# Patient Record
Sex: Male | Born: 1945
Health system: Southern US, Community
[De-identification: ages and names within clinical notes are randomized; demographics above are authoritative.]

## PROBLEM LIST (undated history)

## (undated) DIAGNOSIS — Z860100 Personal history of colon polyps, unspecified: Secondary | ICD-10-CM

## (undated) DIAGNOSIS — M5430 Sciatica, unspecified side: Secondary | ICD-10-CM

## (undated) DIAGNOSIS — M7138 Other bursal cyst, other site: Secondary | ICD-10-CM

## (undated) DIAGNOSIS — Z8619 Personal history of other infectious and parasitic diseases: Secondary | ICD-10-CM

## (undated) DIAGNOSIS — Z91018 Allergy to other foods: Secondary | ICD-10-CM

## (undated) DIAGNOSIS — I10 Essential (primary) hypertension: Secondary | ICD-10-CM

## (undated) DIAGNOSIS — K219 Gastro-esophageal reflux disease without esophagitis: Secondary | ICD-10-CM

## (undated) DIAGNOSIS — D6851 Activated protein C resistance: Secondary | ICD-10-CM

## (undated) HISTORY — DX: Personal history of colon polyps, unspecified: Z86.0100

## (undated) HISTORY — DX: Personal history of other infectious and parasitic diseases: Z86.19

## (undated) HISTORY — DX: Personal history of colon polyps: Z86.010

## (undated) HISTORY — DX: Sciatica, unspecified side: M54.30

## (undated) HISTORY — DX: Allergy to other foods: Z91.018

## (undated) HISTORY — DX: Gastro-esophageal reflux disease without esophagitis: K21.9

## (undated) HISTORY — PX: BACK SURGERY: SHX140

## (undated) HISTORY — DX: Other bursal cyst, other site: M71.38

## (undated) HISTORY — PX: COLONOSCOPY: SHX174

---

## 1971-03-16 HISTORY — PX: TONSILLECTOMY AND ADENOIDECTOMY: SUR1326

## 1999-05-14 ENCOUNTER — Encounter: Admission: RE | Admit: 1999-05-14 | Discharge: 1999-05-14 | Payer: Self-pay | Admitting: Family Medicine

## 1999-05-14 ENCOUNTER — Encounter: Payer: Self-pay | Admitting: Family Medicine

## 2004-08-21 ENCOUNTER — Ambulatory Visit: Payer: Self-pay | Admitting: Internal Medicine

## 2004-09-01 ENCOUNTER — Ambulatory Visit: Payer: Self-pay | Admitting: Internal Medicine

## 2006-05-06 ENCOUNTER — Ambulatory Visit: Payer: Self-pay | Admitting: Internal Medicine

## 2006-05-09 ENCOUNTER — Encounter: Admission: RE | Admit: 2006-05-09 | Discharge: 2006-05-09 | Payer: Self-pay | Admitting: Internal Medicine

## 2006-06-01 ENCOUNTER — Ambulatory Visit: Payer: Self-pay | Admitting: Internal Medicine

## 2006-06-21 ENCOUNTER — Encounter: Admission: RE | Admit: 2006-06-21 | Discharge: 2006-06-21 | Payer: Self-pay | Admitting: Internal Medicine

## 2007-05-18 ENCOUNTER — Ambulatory Visit: Payer: Self-pay | Admitting: Internal Medicine

## 2007-05-18 LAB — CONVERTED CEMR LAB
ALT: 28 units/L (ref 0–53)
AST: 22 units/L (ref 0–37)
Albumin: 3.9 g/dL (ref 3.5–5.2)
Alkaline Phosphatase: 41 units/L (ref 39–117)
BUN: 16 mg/dL (ref 6–23)
Basophils Absolute: 0.1 10*3/uL (ref 0.0–0.1)
Basophils Relative: 0.9 % (ref 0.0–1.0)
Bilirubin Urine: NEGATIVE
Bilirubin, Direct: 0.2 mg/dL (ref 0.0–0.3)
CO2: 31 meq/L (ref 19–32)
Calcium: 9.2 mg/dL (ref 8.4–10.5)
Chloride: 105 meq/L (ref 96–112)
Cholesterol: 198 mg/dL (ref 0–200)
Creatinine, Ser: 1.3 mg/dL (ref 0.4–1.5)
Eosinophils Absolute: 0.2 10*3/uL (ref 0.0–0.6)
Eosinophils Relative: 2.4 % (ref 0.0–5.0)
GFR calc Af Amer: 73 mL/min
GFR calc non Af Amer: 60 mL/min
Glucose, Bld: 101 mg/dL — ABNORMAL HIGH (ref 70–99)
HCT: 48.2 % (ref 39.0–52.0)
HDL: 52.8 mg/dL (ref >39.0)
Hemoglobin, Urine: NEGATIVE
Hemoglobin: 16.2 g/dL (ref 13.0–17.0)
LDL Cholesterol: 122 mg/dL — ABNORMAL HIGH (ref 0–99)
Leukocytes, UA: NEGATIVE
Lymphocytes Relative: 17.7 % (ref 12.0–46.0)
MCHC: 33.7 g/dL (ref 30.0–36.0)
MCV: 92.9 fL (ref 78.0–100.0)
Monocytes Absolute: 0.8 10*3/uL — ABNORMAL HIGH (ref 0.2–0.7)
Monocytes Relative: 10 % (ref 3.0–11.0)
Neutro Abs: 5.4 10*3/uL (ref 1.4–7.7)
Neutrophils Relative %: 69 % (ref 43.0–77.0)
Nitrite: NEGATIVE
PSA: 0.29 ng/mL (ref 0.10–4.00)
Platelets: 194 10*3/uL (ref 150–400)
Potassium: 4.4 meq/L (ref 3.5–5.1)
RBC: 5.19 M/uL (ref 4.22–5.81)
RDW: 12 % (ref 11.5–14.6)
Sodium: 141 meq/L (ref 135–145)
Specific Gravity, Urine: 1.015 (ref 1.000–1.03)
TSH: 1.46 microintl units/mL (ref 0.35–5.50)
Total Bilirubin: 1 mg/dL (ref 0.3–1.2)
Total CHOL/HDL Ratio: 3.8
Total Protein, Urine: NEGATIVE mg/dL
Total Protein: 6.4 g/dL (ref 6.0–8.3)
Triglycerides: 116 mg/dL (ref 0–149)
Urine Glucose: NEGATIVE mg/dL
Urobilinogen, UA: 0.2 (ref 0.0–1.0)
VLDL: 23 mg/dL (ref 0–40)
WBC: 7.9 10*3/uL (ref 4.5–10.5)
pH: 7 (ref 5.0–8.0)

## 2007-05-19 ENCOUNTER — Ambulatory Visit: Payer: Self-pay | Admitting: Internal Medicine

## 2007-05-19 DIAGNOSIS — IMO0002 Reserved for concepts with insufficient information to code with codable children: Secondary | ICD-10-CM | POA: Insufficient documentation

## 2007-05-25 ENCOUNTER — Encounter: Payer: Self-pay | Admitting: *Deleted

## 2007-05-25 DIAGNOSIS — Z87448 Personal history of other diseases of urinary system: Secondary | ICD-10-CM | POA: Insufficient documentation

## 2007-05-25 DIAGNOSIS — Z9089 Acquired absence of other organs: Secondary | ICD-10-CM

## 2008-02-16 ENCOUNTER — Encounter: Payer: Self-pay | Admitting: Internal Medicine

## 2008-06-27 ENCOUNTER — Encounter: Payer: Self-pay | Admitting: Internal Medicine

## 2009-02-25 ENCOUNTER — Telehealth: Payer: Self-pay | Admitting: Internal Medicine

## 2009-03-27 ENCOUNTER — Telehealth: Payer: Self-pay | Admitting: Internal Medicine

## 2009-04-28 ENCOUNTER — Telehealth: Payer: Self-pay | Admitting: Internal Medicine

## 2009-06-18 ENCOUNTER — Encounter: Payer: Self-pay | Admitting: Internal Medicine

## 2009-08-20 ENCOUNTER — Telehealth: Payer: Self-pay | Admitting: Internal Medicine

## 2009-08-29 ENCOUNTER — Encounter (INDEPENDENT_AMBULATORY_CARE_PROVIDER_SITE_OTHER): Payer: Self-pay | Admitting: *Deleted

## 2009-09-10 ENCOUNTER — Encounter (INDEPENDENT_AMBULATORY_CARE_PROVIDER_SITE_OTHER): Payer: Self-pay

## 2009-09-16 ENCOUNTER — Ambulatory Visit: Payer: Self-pay | Admitting: Internal Medicine

## 2009-09-30 ENCOUNTER — Ambulatory Visit: Payer: Self-pay | Admitting: Internal Medicine

## 2009-09-30 DIAGNOSIS — Z860100 Personal history of colon polyps, unspecified: Secondary | ICD-10-CM | POA: Insufficient documentation

## 2009-10-07 ENCOUNTER — Encounter: Payer: Self-pay | Admitting: Internal Medicine

## 2010-01-29 ENCOUNTER — Telehealth: Payer: Self-pay | Admitting: Internal Medicine

## 2010-02-13 ENCOUNTER — Ambulatory Visit: Payer: Self-pay | Admitting: Internal Medicine

## 2010-02-13 LAB — CONVERTED CEMR LAB
ALT: 24 units/L (ref 0–53)
AST: 24 units/L (ref 0–37)
Albumin: 4 g/dL (ref 3.5–5.2)
Alkaline Phosphatase: 42 units/L (ref 39–117)
BUN: 23 mg/dL (ref 6–23)
Basophils Absolute: 0 10*3/uL (ref 0.0–0.1)
Basophils Relative: 0.7 % (ref 0.0–3.0)
Bilirubin Urine: NEGATIVE
Bilirubin, Direct: 0.1 mg/dL (ref 0.0–0.3)
CO2: 29 meq/L (ref 19–32)
Calcium: 9.3 mg/dL (ref 8.4–10.5)
Chloride: 106 meq/L (ref 96–112)
Cholesterol: 211 mg/dL — ABNORMAL HIGH (ref 0–200)
Creatinine, Ser: 1.1 mg/dL (ref 0.4–1.5)
Direct LDL: 131.7 mg/dL
Eosinophils Absolute: 0.3 10*3/uL (ref 0.0–0.7)
Eosinophils Relative: 4.5 % (ref 0.0–5.0)
GFR calc non Af Amer: 73.48 mL/min (ref >60)
Glucose, Bld: 91 mg/dL (ref 70–99)
HCT: 46.4 % (ref 39.0–52.0)
HDL: 59.3 mg/dL (ref >39.00)
Hemoglobin, Urine: NEGATIVE
Hemoglobin: 15.9 g/dL (ref 13.0–17.0)
Ketones, ur: NEGATIVE mg/dL
Leukocytes, UA: NEGATIVE
Lymphocytes Relative: 24.6 % (ref 12.0–46.0)
Lymphs Abs: 1.4 10*3/uL (ref 0.7–4.0)
MCHC: 34.2 g/dL (ref 30.0–36.0)
MCV: 93.2 fL (ref 78.0–100.0)
Monocytes Absolute: 0.7 10*3/uL (ref 0.1–1.0)
Monocytes Relative: 11.8 % (ref 3.0–12.0)
Neutro Abs: 3.3 10*3/uL (ref 1.4–7.7)
Neutrophils Relative %: 58.4 % (ref 43.0–77.0)
Nitrite: NEGATIVE
PSA: 0.29 ng/mL (ref 0.10–4.00)
Platelets: 195 10*3/uL (ref 150.0–400.0)
Potassium: 4.7 meq/L (ref 3.5–5.1)
RBC: 4.99 M/uL (ref 4.22–5.81)
RDW: 13.3 % (ref 11.5–14.6)
Sodium: 141 meq/L (ref 135–145)
Specific Gravity, Urine: 1.01 (ref 1.000–1.030)
TSH: 1.71 microintl units/mL (ref 0.35–5.50)
Total Bilirubin: 1 mg/dL (ref 0.3–1.2)
Total CHOL/HDL Ratio: 4
Total Protein, Urine: NEGATIVE mg/dL
Total Protein: 6.5 g/dL (ref 6.0–8.3)
Triglycerides: 58 mg/dL (ref 0.0–149.0)
Urine Glucose: NEGATIVE mg/dL
Urobilinogen, UA: 0.2 (ref 0.0–1.0)
VLDL: 11.6 mg/dL (ref 0.0–40.0)
WBC: 5.7 10*3/uL (ref 4.5–10.5)
pH: 7 (ref 5.0–8.0)

## 2010-02-19 ENCOUNTER — Ambulatory Visit: Payer: Self-pay | Admitting: Internal Medicine

## 2010-02-19 ENCOUNTER — Encounter: Payer: Self-pay | Admitting: Internal Medicine

## 2010-04-14 NOTE — Letter (Signed)
Summary: Patient Notice- Polyp Results   Gastroenterology  [address] [place], [place] [address]   Phone: [phone]  Fax: [phone]        October 07, 2009 MRN: [id number]    [facility] [address] [place], [place]  [address]    Dear Steven Hartman,  The polyps removed from your colon were adenomatous. This means that they were pre-cancerous or that  they had the potential to change into cancer over time.  I recommend that you have a repeat colonoscopy in 5 years to determine if you have developed any new polyps over time. If you develop any new rectal bleeding, abdominal pain or significant bowel habit changes, please contact us before then.  Please call us if you are having persistent problems or have questions about your condition that have not been fully answered at this time.  Sincerely,  Iva Boop MD, [facility]  This letter has been electronically signed by your physician.  Appended Document: Patient Notice- Polyp Results letter mailed

## 2010-04-14 NOTE — Letter (Signed)
Summary: [facility]  [facility]   Imported By: Sherian Rein 07/10/2009 09:12:44  _____________________________________________________________________  External Attachment:    Type:   Image     Comment:   External Document

## 2010-04-14 NOTE — Miscellaneous (Signed)
Summary: Lec previsit  Clinical Lists Changes  Medications: Added new medication of MOVIPREP 100 GM  SOLR (PEG-KCL-NACL-NASULF-NA ASC-C) As per prep instructions. - Signed Rx of MOVIPREP 100 GM  SOLR (PEG-KCL-NACL-NASULF-NA ASC-C) As per prep instructions.;  #1 x 0;  Signed;  Entered by: Ulis Rias RN;  Authorized by: Iva Boop MD, FACG;  Method used: Electronically to [facility]*, [address] [place], [place]  [address], Ph: [phone] or [phone], Fax: [phone] Observations: Added new observation of NKA: T (09/16/2009 13:25)    Prescriptions: MOVIPREP 100 GM  SOLR (PEG-KCL-NACL-NASULF-NA ASC-C) As per prep instructions.  #1 x 0   Entered by:   Ulis Rias RN   Authorized by:   Iva Boop MD, [facility]   Signed by:   Ulis Rias RN on 09/16/2009   Method used:   Electronically to        [facility]* (retail)       [address] [place], [place]  [address]       Ph: [phone] or [phone]       Fax: [phone]   RxID:   [id number]

## 2010-04-14 NOTE — Procedures (Signed)
Summary: Colonoscopy  Patient: Alexy Heldt Note: All result statuses are Final unless otherwise noted.  Tests: (1) Colonoscopy (COL)   COL Colonoscopy           DONE     [facility]     [address]     [place], [place]  [address]           COLONOSCOPY PROCEDURE REPORT           PATIENT:  Jahvon, Gosline  MR#:  [id number]     BIRTHDATE:  [birth year], 64 yrs. old  GENDER:  male     ENDOSCOPIST:  Iva Boop, MD, [facility]           PROCEDURE DATE:  09/30/2009     PROCEDURE:  Colonoscopy with biopsy     ASA CLASS:  Class I     INDICATIONS:  Elevated Risk Screening, family Hx of polyps     MEDICATIONS:   Fentanyl 50 mcg IV, Versed 7 mg IV           DESCRIPTION OF PROCEDURE:   After the risks benefits and     alternatives of the procedure were thoroughly explained, informed     consent was obtained.  Digital rectal exam was performed and     revealed no abnormalities and normal prostate.   The LB CF-H180AL     [id number] endoscope was introduced through the anus and advanced to     the cecum, which was identified by both the appendix and ileocecal     valve, without limitations.  The quality of the prep was     excellent, using MoviPrep.  The instrument was then slowly     withdrawn as the colon was fully examined.     Insertion: 3:23 minutes Withdrawal: 9:39 minutes     <<PROCEDUREIMAGES>>           FINDINGS:  Two polyps were found. 2-3 mm ascending colon polyp     and 3-4 mm possible cecal polyp. The polyps were removed using cold     biopsy forceps.  Mild diverticulosis was found in the sigmoid     colon.  This was otherwise a normal examination of the colon.     Retroflexed views in the right colon and rectum revealed no     abnormalities.    The scope was then withdrawn from the patient     and the procedure completed.           COMPLICATIONS:  None     ENDOSCOPIC IMPRESSION:     1) Two polyps removed (very small)     2) Mild diverticulosis in the  sigmoid colon     3) Otherwise normal examination, excellent prep           REPEAT EXAM:  In for Colonoscopy, pending biopsy results. 1     brother with polyp(s) so based upon current guidelines if no     adenomas would repeat routine screening in 10 years.           Iva Boop, MD, [facility]           CC:  The Patient           n.     eSIGNED:   Iva Boop at 09/30/2009 02:13 PM           Faniel, Gasper Sells, [phone]  Note: An exclamation mark (!) indicates a result that was not dispersed into the  flowsheet. Document Creation Date: 09/30/2009 2:13 PM _______________________________________________________________________  (1) Order result status: Final Collection or observation date-time: 09/30/2009 14:03 Requested date-time:  Receipt date-time:  Reported date-time:  Referring Physician:   Ordering Physician: Stan Head [id number]) Specimen Source:  Source: Launa Grill Order Number: [id number] Lab site:   Appended Document: Colonoscopy     Procedures Next Due Date:    Colonoscopy: 10/2014

## 2010-04-14 NOTE — Progress Notes (Signed)
  Phone Note Refill Request Message from:  Fax from Pharmacy on March 27, 2009 2:44 PM  recieved fax from [facility]. Please Advise refill on Meloxicam 15mg  one tab daily.  Initial call taken by: Ami Bullins CMA,  March 27, 2009 2:45 PM  Follow-up for Phone Call        OK for refill on meloxicam as needed  Follow-up by: Jacques Navy MD,  March 27, 2009 5:31 PM    New/Updated Medications: MELOXICAM 15 MG TABS (MELOXICAM) 1 tab once daily Prescriptions: MELOXICAM 15 MG TABS (MELOXICAM) 1 tab once daily  #30 x 1   Entered by:   Ami Bullins CMA   Authorized by:   Jacques Navy MD   Signed by:   Bill Salinas CMA on 03/28/2009   Method used:   Electronically to        [facility]* (retail)       [address] [place], [place]  [address]       Ph: [phone] or [phone]       Fax: [phone]   RxID:   [id number]

## 2010-04-14 NOTE — Progress Notes (Signed)
Summary: Allegra refill  Phone Note Refill Request Message from:  Fax from Pharmacy on April 28, 2009 4:23 PM  Refills Requested: Medication #1:  ALLEGRA 180 MG TABS Take 1 tablet by mouth once a day   Dosage confirmed as above?Dosage Confirmed Initial call taken by: Lucious Groves,  April 28, 2009 4:23 PM    Prescriptions: ALLEGRA 180 MG TABS (FEXOFENADINE HCL) Take 1 tablet by mouth once a day  #30 x 12   Entered by:   Lucious Groves   Authorized by:   Jacques Navy MD   Signed by:   Lucious Groves on 04/28/2009   Method used:   Electronically to        [facility]* (retail)       [address] [place], [place]  [address]       Ph: [phone] or [phone]       Fax: [phone]   RxID:   [id number]

## 2010-04-14 NOTE — Letter (Signed)
Summary: Moviprep Instructions  [facility]  [address]   [place], [place] [address]   Phone: [phone]  Fax: [phone]       Steven Hartman    [birth year]    MRN: [id number]        Procedure Day /Date: Tuesday, 09-30-09     Arrival Time: 12:30 p.m.     Procedure Time: 1:30 p.m.     Location of Procedure:                    x   [facility] (4th Floor)                        PREPARATION FOR COLONOSCOPY WITH MOVIPREP   Starting 5 days prior to your procedure 09-25-09  do not eat nuts, seeds, popcorn, corn, beans, peas,  salads, or any raw vegetables.  Do not take any fiber supplements (e.g. Metamucil, Citrucel, and Benefiber).  THE DAY BEFORE YOUR PROCEDURE         DATE: 09-29-09   DAY: Monday  1.  Drink clear liquids the entire day-NO SOLID FOOD  2.  Do not drink anything colored red or purple.  Avoid juices with pulp.  No orange juice.  3.  Drink at least 64 oz. (8 glasses) of fluid/clear liquids during the day to prevent dehydration and help the prep work efficiently.  CLEAR LIQUIDS INCLUDE: Water Jello Ice Popsicles Tea (sugar ok, no milk/cream) Powdered fruit flavored drinks Coffee (sugar ok, no milk/cream) Gatorade Juice: apple, white grape, white cranberry  Lemonade Clear bullion, consomm, broth Carbonated beverages (any kind) Strained chicken noodle soup Hard Candy                             4.  In the morning, mix first dose of MoviPrep solution:    Empty 1 Pouch A and 1 Pouch B into the disposable container    Add lukewarm drinking water to the top line of the container. Mix to dissolve    Refrigerate (mixed solution should be used within 24 hrs)  5.  Begin drinking the prep at 5:00 p.m. The MoviPrep container is divided by 4 marks.   Every 15 minutes drink the solution down to the next mark (approximately 8 oz) until the full liter is complete.   6.  Follow completed prep with 16 oz of clear liquid of your  choice (Nothing red or purple).  Continue to drink clear liquids until bedtime.  7.  Before going to bed, mix second dose of MoviPrep solution:    Empty 1 Pouch A and 1 Pouch B into the disposable container    Add lukewarm drinking water to the top line of the container. Mix to dissolve    Refrigerate  THE DAY OF YOUR PROCEDURE      DATE: 09-30-09   DAY: Tuesday  Beginning at 8:30 a.m. (5 hours before procedure):         1. Every 15 minutes, drink the solution down to the next mark (approx 8 oz) until the full liter is complete.  2. Follow completed prep with 16 oz. of clear liquid of your choice.    3. You may drink clear liquids until 11:30 a.m.  (2 HOURS BEFORE PROCEDURE).   MEDICATION INSTRUCTIONS  Unless otherwise instructed, you should take regular prescription medications with a small sip of water   as early  as possible the morning of your procedure.         OTHER INSTRUCTIONS  You will need a responsible adult at least 63 years of age to accompany you and drive you home.   This person must remain in the waiting room during your procedure.  Wear loose fitting clothing that is easily removed.  Leave jewelry and other valuables at home.  However, you may wish to bring a book to read or  an iPod/MP3 player to listen to music as you wait for your procedure to start.  Remove all body piercing jewelry and leave at home.  Total time from sign-in until discharge is approximately 2-3 hours.  You should go home directly after your procedure and rest.  You can resume normal activities the  day after your procedure.  The day of your procedure you should not:   Drive   Make legal decisions   Operate machinery   Drink alcohol   Return to work  You will receive specific instructions about eating, activities and medications before you leave.    The above instructions have been reviewed and explained to me by   Ulis Rias RN  September 16, 2009 1:44 PM     I  fully understand and can verbalize these instructions _____________________________ Date _________

## 2010-04-14 NOTE — Progress Notes (Signed)
Summary: Colon Recall  Phone Note Outgoing Call Call back at Home Phone [phone]   Call placed by: Francee Piccolo CMA Duncan Dull),  August 20, 2009 3:29 PM Summary of Call: message left for pt to return call regarding colon recall Francee Piccolo CMA Duncan Dull)  August 20, 2009 3:29 PM   RC from pt.  He states his brother has a history of colon polyps-unsure type.  There is no family history of colon cancer.  I advised pt Dr. Leone Payor would review this information and we would call him later today or the first of next week with recommendation of when to repeat colonoscopy.  Pt voices understanding and is agreeable. Initial call taken by: Francee Piccolo CMA Duncan Dull),  August 22, 2009 11:34 AM  Follow-up for Phone Call        repeat colonoscopy this year re: screening and family hx of polyps Follow-up by: Iva Boop MD, [facility],  August 26, 2009 10:36 AM  Additional Follow-up for Phone Call Additional follow up Details #1::        message left with young male at home number for pt to return call. Francee Piccolo CMA Duncan Dull)  August 26, 2009 2:28 PM   no answer at home number. Pt no longer employed at work number.  Francee Piccolo CMA Duncan Dull)  August 29, 2009 9:47 AM   RC from pt.  Colonoscopy scheduled for 09/30/09.  Letter mailed. Additional Follow-up by: Francee Piccolo CMA Duncan Dull),  August 29, 2009 10:06 AM

## 2010-04-14 NOTE — Progress Notes (Signed)
  Phone Note Refill Request Message from:  Patient on January 29, 2010 9:29 AM  Refills Requested: Medication #1:  NASACORT AQ 55 MCG/ACT AERS as needed  Medication #2:  MELOXICAM 15 MG TABS 1 tab once daily. Initial call taken by: Ami Bullins CMA,  January 29, 2010 9:30 AM    Prescriptions: MELOXICAM 15 MG TABS (MELOXICAM) 1 tab once daily  #30 x 1   Entered by:   Ami Bullins CMA   Authorized by:   Jacques Navy MD   Signed by:   Bill Salinas CMA on 01/29/2010   Method used:   Electronically to        [facility]* (retail)       [address] [place], [place]  [address]       Ph: [phone] or [phone]       Fax: [phone]   RxID:   [id number] NASACORT AQ 55 MCG/ACT AERS (TRIAMCINOLONE ACETONIDE(NASAL)) as needed  #1 x 3   Entered by:   Bill Salinas CMA   Authorized by:   Jacques Navy MD   Signed by:   Bill Salinas CMA on 01/29/2010   Method used:   Electronically to        [facility]* (retail)       [address] [place], [place]  [address]       Ph: [phone] or [phone]       Fax: [phone]   RxID:   [id number]

## 2010-04-14 NOTE — Assessment & Plan Note (Signed)
Summary: Cpx/Bcbs/#/cd   Vital Signs:  Patient profile:   65 year old male Height:      71 inches Weight:      202 pounds BMI:     28.28 O2 Sat:      97 % on Room air Temp:     97.1 degrees F oral Pulse rate:   60 / minute BP sitting:   138 / 80  (left arm)  Vitals Entered By: Bill Salinas CMA (February 19, 2010 10:14 AM)  O2 Flow:  Room air CC: cpx/ab   Primary Care Provider:  Norins  CC:  cpx/ab.  History of Present Illness: Patient presents for routine medical follow-up and exam. He has been feeling well. In the interval since his last exam he has had no major medical illness, no surgery and no injury. He has gained some weight.  Current Medications (verified): 1)  Fish Oil 1000 Mg  Caps (Omega-3 Fatty Acids) .Marland Kitchen.. 1 Tablet Every Day 2)  Allegra 180 Mg Tabs (Fexofenadine Hcl) .... Take 1 Tablet By Mouth Once A Day 3)  Diclofenac Sodium 75 Mg  Tbec (Diclofenac Sodium) .Marland Kitchen.. 1 By Mouth Once Daily or Every Other Day 4)  Nasacort Aq 55 Mcg/act Aers (Triamcinolone Acetonide(Nasal)) .... As Needed 5)  Meloxicam 15 Mg Tabs (Meloxicam) .Marland Kitchen.. 1 Tab Once Daily  Allergies (verified): No Known Drug Allergies  Past History:  Past Surgical History: Last updated: 05/25/2007 VASECTOMY, HX OF (ICD-V26.52) TONSILLECTOMY, HX OF (ICD-V45.79)  Family History: Last updated: 30-May-2007 father-deceased @70 : RA-shoulder-infection-sepsis/staph mother - deceased @88 : Parkinsons Prostate cancer - uncle Neg- Colon cancer, DM, CAD  Past Medical History: UCD GASTRITIS, HX OF MILD (ICD-V12.79) Hx of CONCUSSION X3 SECONDARY TO FOOTBALL ACCIDENTS (ICD-850.9) PROSTATITIS, HX OF (ICD-V13.09) BACK PAIN WITH RADICULOPATHY (ICD-729.2)     Social History: [employer] - BS biology ; MEd- [employer]. 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  Review of Systems  The patient denies anorexia, fever, weight loss, weight gain, vision loss, decreased hearing, hoarseness, chest pain, syncope, dyspnea on exertion, peripheral edema, prolonged cough, headaches, abdominal pain, severe indigestion/heartburn, hematuria, incontinence, muscle weakness, suspicious skin lesions, difficulty walking, depression, unusual weight change, abnormal bleeding, enlarged lymph nodes, angioedema, and testicular masses.    Physical Exam  General:  alert, well-developed, well-nourished, well-hydrated, normal appearance, and healthy-appearing.   Head:  Normocephalic and atraumatic without obvious abnormalities. No apparent alopecia or balding. Eyes:  vision grossly intact, pupils equal, pupils round, corneas and lenses clear, and no injection.   Ears:  External ear exam shows no significant lesions or deformities.  Otoscopic examination reveals clear canals, tympanic membranes are intact bilaterally without bulging, retraction, inflammation or discharge. Hearing is grossly normal bilaterally. Nose:  no external deformity and no external erythema.   Mouth:  Oral mucosa and oropharynx without lesions or exudates.  Teeth in good repair. Neck:  supple, full ROM, no thyromegaly, and no carotid bruits.   Chest Wall:  No deformities, masses, tenderness or gynecomastia noted. Lungs:  Normal respiratory effort, chest expands symmetrically. Lungs are clear to auscultation, no crackles or wheezes. Heart:  Normal rate and regular rhythm. S1 and S2 normal without gallop, murmur, click, rub or other extra sounds. Abdomen:  soft, non-tender, normal bowel sounds, no guarding, and no hepatomegaly.   Prostate:  deferred to normal PSA and absent symptoms Msk:  normal ROM,  no joint tenderness, no joint swelling, no joint warmth, no joint deformities, and no joint instability.   Pulses:  2+ radial and DP pulses Extremities:  No clubbing, cyanosis, edema, or deformity noted  with normal full range of motion of all joints.   Neurologic:  alert & oriented X3, cranial nerves II-XII intact, strength normal in all extremities, gait normal, and DTRs symmetrical and normal.   Skin:  turgor normal, color normal, and no suspicious lesions.   Cervical Nodes:  no anterior cervical adenopathy and no posterior cervical adenopathy.   Inguinal Nodes:  no R inguinal adenopathy and no L inguinal adenopathy.   Psych:  Oriented X3, memory intact for recent and remote, normally interactive, good eye contact, and not anxious appearing.     Impression & Recommendations:  Problem # 1:  BACK PAIN WITH RADICULOPATHY (ICD-729.2) Has known spinal cyst and has seen neurosurgery in the past. He is generally doing well and has no limitations in activity. He does exercise and when needed  takes NSAID (meloxicam) with good relief.  Plan - continue present regimen with no need for further diagnostic testing at this time.  Problem # 2:  Preventive Health Care (ICD-V70.0) Unremarkable interval history. Physical exam is normal except for being mildly overweight. Discussed weight management: smart food choices, PORTION SIZE control and continued exercise. Target weight 185 lbs (BMI goal of 26-27). Goal - to loose 1 lb per month. Current with prostate cancer screening with low PSA. Current with colorectal cancer screening with last colonoscopy July '11. Immunizations - Tetnus and pneumovax today. He will check on coverage for shingles vaccine. Lipds - LDL is just over NCEP goal of 130. Framingham cardiac Risk calculation = 10 year risk of a cardiac event is 10% = low risk category. 12 Lead EKG is normal with no signs of ischemia.  In summary - a very nice man who is medically stable. He is counseled as noted on weight management which will in turn bring Lipid panel more in line with recommendations and BP into normal range from prehypertensive range. If BP does not come down to 130/80's will consider  medical intervention for prehypertension.  He will return in 1 year or as needed.  Complete Medication List: 1)  Fish Oil 1000 Mg Caps (Omega-3 fatty acids) .Marland Kitchen.. 1 tablet every day 2)  Allegra 180 Mg Tabs (Fexofenadine hcl) .... Take 1 tablet by mouth once a day 3)  Diclofenac Sodium 75 Mg Tbec (Diclofenac sodium) .Marland Kitchen.. 1 by mouth once daily or every other day 4)  Nasacort Aq 55 Mcg/act Aers (Triamcinolone acetonide(nasal)) .... As needed 5)  Meloxicam 15 Mg Tabs (Meloxicam) .Marland Kitchen.. 1 tab once daily  Other Orders: Tdap => 34yrs IM (82956) Pneumococcal Vaccine (21308) Admin 1st Vaccine (65784) Admin of Any Addtl Vaccine (69629)  Patient: Steven Hartman Note: All result statuses are Final unless otherwise noted.  Tests: (1) BMP (METABOL)   Sodium                    141 mEq/L                   135-145   Potassium                 4.7 mEq/L                   3.5-5.1   Chloride  106 mEq/L                   96-112   Carbon Dioxide            29 mEq/L                    19-32   Glucose                   91 mg/dL                    40-98   BUN                       23 mg/dL                    1-19   Creatinine                1.1 mg/dL                   1.4-7.8   Calcium                   9.3 mg/dL                   2.9-56.2   GFR                       73.48 mL/min                >60  Tests: (2) CBC Platelet w/Diff (CBCD)   White Cell Count          5.7 K/uL                    4.5-10.5   Red Cell Count            4.99 Mil/uL                 4.22-5.81   Hemoglobin                15.9 g/dL                   13.0-86.5   Hematocrit                46.4 %                      39.0-52.0   MCV                       93.2 fl                     78.0-100.0   MCHC                      34.2 g/dL                   78.4-69.6   RDW                       13.3 %                      11.5-14.6   Platelet Count            195.0 K/uL  150.0-400.0   Neutrophil %               58.4 %                      43.0-77.0   Lymphocyte %              24.6 %                      12.0-46.0   Monocyte %                11.8 %                      3.0-12.0   Eosinophils%              4.5 %                       0.0-5.0   Basophils %               0.7 %                       0.0-3.0   Neutrophill Absolute      3.3 K/uL                    1.4-7.7   Lymphocyte Absolute       1.4 K/uL                    0.7-4.0   Monocyte Absolute         0.7 K/uL                    0.1-1.0  Eosinophils, Absolute                             0.3 K/uL                    0.0-0.7   Basophils Absolute        0.0 K/uL                    0.0-0.1  Tests: (3) Hepatic/Liver Function Panel (HEPATIC)   Total Bilirubin           1.0 mg/dL                   1.6-1.0   Direct Bilirubin          0.1 mg/dL                   9.6-0.4   Alkaline Phosphatase      42 U/L                      39-117   AST                       24 U/L                      0-37   ALT                       24 U/L  0-53   Total Protein             6.5 g/dL                    1.6-1.0   Albumin                   4.0 g/dL                    9.6-0.4  Tests: (4) TSH (TSH)   FastTSH                   1.71 uIU/mL                 0.35-5.50  Tests: (5) Lipid Panel (LIPID)   Cholesterol          [H]  211 mg/dL                   5-409     ATP III Classification            Desirable:  < 200 mg/dL                    Borderline High:  200 - 239 mg/dL               High:  > = 240 mg/dL   Triglycerides             58.0 mg/dL                  8.1-191.4     Normal:  <150 mg/dL     Borderline High:  782 - 199 mg/dL   HDL                       95.62 mg/dL                 >13.08   VLDL Cholesterol          11.6 mg/dL                  6.5-78.4  CHO/HDL Ratio:  CHD Risk                             4                    Men          Women     1/2 Average Risk     3.4          3.3     Average Risk          5.0          4.4     2X  Average Risk          9.6          7.1     3X Average Risk          15.0          11.0                           Tests: (6) UDip Only (UDIP)   Color                     LT. YELLOW       RANGE:  Yellow;Lt. Yellow   Clarity                   CLEAR                       Clear   Specific Gravity          1.010                       1.000 - 1.030   Urine Ph                  7.0                         5.0-8.0   Protein                   NEGATIVE                    Negative   Urine Glucose             NEGATIVE                    Negative   Ketones                   NEGATIVE                    Negative   Urine Bilirubin           NEGATIVE                    Negative   Blood                     NEGATIVE                    Negative   Urobilinogen              0.2                         0.0 - 1.0   Leukocyte Esterace        NEGATIVE                    Negative   Nitrite                   NEGATIVE                    Negative  Tests: (7) Prostate Specific Antigen (PSA)   PSA-Hyb                   0.29 ng/mL                  0.10-4.00  Tests: (8) Cholesterol LDL - Direct (DIRLDL)  Cholesterol LDL - Direct                             131.7 mg/dL     Optimal:  <409 mg/dL     Near or Above Optimal:  100-129 mg/dL     Borderline High:  811-914 mg/dL     High:  782-956 mg/dL      Very High:  >213 mg/dLPrescriptions: MELOXICAM 15 MG TABS (MELOXICAM) 1 tab once daily  #30 x  12   Entered and Authorized by:   Jacques Navy MD   Signed by:   Jacques Navy MD on 02/19/2010   Method used:   Electronically to        [facility]* (retail)       [address] [place], [place]  [address]       Ph: [phone] or [phone]       Fax: [phone]   RxID:   [id number]    Orders Added: 1)  Tdap => 34yrs IM [90715] 2)  Pneumococcal Vaccine [90732] 3)  Admin 1st Vaccine [90471] 4)  Admin of Any Addtl Vaccine [90472] 5)  Est.  Patient 40-64 years [75102]   Immunizations Administered:  Tetanus Vaccine:    Vaccine Type: Tdap    Site: right deltoid    Mfr: GlaxoSmithKline    Dose: 0.5 ml    Route: IM    Given by: Ami Bullins CMA    Exp. Date: 01/02/2012    Lot #: [id number]    VIS given: 01/31/08 version given February 19, 2010.  Pneumonia Vaccine:    Vaccine Type: Pneumovax    Site: left deltoid    Mfr: Merck    Dose: 0.5 ml    Route: IM    Given by: Ami Bullins CMA    Exp. Date: 07/23/2011    Lot #: [id number]    VIS given: 02/17/09 version given February 19, 2010.   Immunizations Administered:  Tetanus Vaccine:    Vaccine Type: Tdap    Site: right deltoid    Mfr: GlaxoSmithKline    Dose: 0.5 ml    Route: IM    Given by: Ami Bullins CMA    Exp. Date: 01/02/2012    Lot #: [id number]    VIS given: 01/31/08 version given February 19, 2010.  Pneumonia Vaccine:    Vaccine Type: Pneumovax    Site: left deltoid    Mfr: Merck    Dose: 0.5 ml    Route: IM    Given by: Ami Bullins CMA    Exp. Date: 07/23/2011    Lot #: [id number]    VIS given: 02/17/09 version given February 19, 2010.

## 2010-04-14 NOTE — Letter (Signed)
Summary: Previsit letter  [facility]  [address] [place], [place] [address]   Phone: [phone]  Fax: [phone]       08/29/2009 MRN: [id number]  [facility] [address] [place], [place]  [address]  Dear Steven Hartman,  Welcome to the Gastroenterology Division at [facility].    You are scheduled to see a nurse for your pre-procedure visit on September 12, 2009 at 4:30 PM on the 3rd floor at [facility], [address]  We ask that you try to arrive at our office 15 minutes prior to your appointment time to allow for check-in.  Your nurse visit will consist of discussing your medical and surgical history, your immediate family medical history, and your medications.    Please bring a complete list of all your medications or, if you prefer, bring the medication bottles and we will list them.  We will need to be aware of both prescribed and over the counter drugs.  We will need to know exact dosage information as well.  If you are on blood thinners (Coumadin, Plavix, Aggrenox, Ticlid, etc.) please call our office today/prior to your appointment, as we need to consult with your physician about holding your medication.   Please be prepared to read and sign documents such as consent forms, a financial agreement, and acknowledgement forms.  If necessary, and with your consent, a friend or relative is welcome to sit-in on the nurse visit with you.  Please bring your insurance card so that we may make a copy of it.  If your insurance requires a referral to see a specialist, please bring your referral form from your primary care physician.  No co-pay is required for this nurse visit.     If you cannot keep your appointment, please call [phone] to cancel or reschedule prior to your appointment date.  This allows [place] the opportunity to schedule an appointment for another patient in need of care.    Thank you for choosing [facility]  for your medical needs.  We appreciate the opportunity to care for you.  Please visit [place] at our website  to learn more about our practice.                     Sincerely.                                                                                                                   The Gastroenterology Division

## 2011-03-11 ENCOUNTER — Telehealth: Payer: Self-pay | Admitting: *Deleted

## 2011-03-11 MED ORDER — TRIAMCINOLONE ACETONIDE(NASAL) 55 MCG/ACT NA INHA: 2.0000 | Freq: Every day | NASAL | Status: DC

## 2011-03-11 NOTE — Telephone Encounter (Signed)
Ok  X 2

## 2011-03-11 NOTE — Telephone Encounter (Signed)
Refill request for Triamcinolone Acton 55 mcg. Original qty 16.5. Please Advise refills

## 2011-03-26 ENCOUNTER — Other Ambulatory Visit: Payer: Self-pay | Admitting: Internal Medicine

## 2011-08-18 ENCOUNTER — Other Ambulatory Visit: Payer: Self-pay | Admitting: Internal Medicine

## 2013-04-24 ENCOUNTER — Other Ambulatory Visit (INDEPENDENT_AMBULATORY_CARE_PROVIDER_SITE_OTHER): Payer: Managed Care, Other (non HMO)

## 2013-04-24 ENCOUNTER — Ambulatory Visit (INDEPENDENT_AMBULATORY_CARE_PROVIDER_SITE_OTHER): Payer: Managed Care, Other (non HMO) | Admitting: Internal Medicine

## 2013-04-24 ENCOUNTER — Encounter: Payer: Self-pay | Admitting: Internal Medicine

## 2013-04-24 VITALS — BP 114/90 | HR 60 | Temp 97.0°F | Ht 71.0 in | Wt 204.0 lb

## 2013-04-24 DIAGNOSIS — IMO0002 Reserved for concepts with insufficient information to code with codable children: Secondary | ICD-10-CM

## 2013-04-24 DIAGNOSIS — Z0000 Encounter for general adult medical examination without abnormal findings: Secondary | ICD-10-CM

## 2013-04-24 DIAGNOSIS — Z23 Encounter for immunization: Secondary | ICD-10-CM

## 2013-04-24 LAB — BASIC METABOLIC PANEL
BUN: 20 mg/dL (ref 6–23)
CO2: 27 mEq/L (ref 19–32)
CREATININE: 1.2 mg/dL (ref 0.4–1.5)
Calcium: 8.9 mg/dL (ref 8.4–10.5)
Chloride: 105 mEq/L (ref 96–112)
GFR: 66.99 mL/min (ref >60.00)
Glucose, Bld: 92 mg/dL (ref 70–99)
Potassium: 4.1 mEq/L (ref 3.5–5.1)
Sodium: 138 mEq/L (ref 135–145)

## 2013-04-24 LAB — TSH: TSH: 1.9 u[IU]/mL (ref 0.35–5.50)

## 2013-04-24 LAB — LIPID PANEL
Cholesterol: 219 mg/dL — ABNORMAL HIGH (ref 0–200)
HDL: 60.3 mg/dL (ref >39.00)
Total CHOL/HDL Ratio: 4
Triglycerides: 98 mg/dL (ref 0.0–149.0)
VLDL: 19.6 mg/dL (ref 0.0–40.0)

## 2013-04-24 LAB — LDL CHOLESTEROL, DIRECT: Direct LDL: 146.6 mg/dL

## 2013-04-24 NOTE — Assessment & Plan Note (Addendum)
This is a very pleasant 68 year old man who presents for a routine checkup. He has no major illness, injury, or surgery since his last visit and expresses no major complaints today. Physical exam is normal. Lab reviewed: Bmet is normal; lipid with HDL 60, LDL 146 - above goal of 130 or less but very protective LDL/HDL. He is current with colorectal cancer screening. Discussed pros and cons of prostate cancer screening (USPHCTF recommendations reviewed and AUA  April '13 recommendations) and he defers evaluation at this time. PSA '09, '11 was 0.29. Immunizations - Prevnar today, he will return for pneumovax and Zostavax as scheduled.  In summary A very nice man who appear medically stable. Will address elevated LDL with diet and exercise as first line with repeat lab to follow. He will return for general exam in 1 year.

## 2013-04-24 NOTE — Progress Notes (Signed)
Pre visit review using our clinic review tool, if applicable. No additional management support is needed unless otherwise documented below in the visit note. 

## 2013-04-24 NOTE — Patient Instructions (Addendum)
It has been a pleasure providing medical care for you today.  Health Maintenance - Prevnar vaccine (13 valent pneumonia vaccine)- today - Shingles vaccine - After June 13 2013. Check with your insurance to make sure it's covered. Can schedule this as a nurse's visit. - Will need Pneumovax (23 valent pneumonia vaccine) booster in 2016. - Colonoscopy in 2011 showed an adenomatous polyp- 5 year follow up in 2016. GI should send you notice.  - Checked your lipids, metabolic panel, and TSH today.

## 2013-04-24 NOTE — Progress Notes (Signed)
Subjective:     Patient ID: Steven Hartman, male   DOB: Jul 08, 1945, 68 y.o.   MRN: [id number]  HPI  The patient is here for annual wellness examination.   The risk factors are reflected in the social history.  The roster of all physicians providing medical care to patient - is listed in the Snapshot section of the chart.  Activities of daily living:  The patient is 100% inedpendent in all ADLs: dressing, toileting, feeding as well as independent mobility. No falls.   Home safety : The patient has smoke detectors in the home. They wear seatbelts. Firearms are present in the home, kept in a safe fashion. There is no violence in the home.   There is no risks for hepatitis, STDs or HIV. There is no   history of blood transfusion. They have no travel history to infectious disease endemic areas of the world.  The patient  Has not seen their dentist in the last six month. They have not  seen their eye doctor in the last year. Will schedule appointment with Dr. Katy Fitch. They deny any hearing difficulty and have not had audiologic testing in the last year.  They do not  have excessive sun exposure. Discussed the need for sun protection: hats, long sleeves and use of sunscreen if there is significant sun exposure.   Diet: the importance of a healthy diet is discussed. They do have a healthy diet with occasional splurges.  The patient has a regular exercise program: Upper body / core strength training. Cardio at the [employer]. 30 minutes duration,5 per week.  The benefits of regular aerobic exercise were discussed.  Depression screen: there are no signs or vegative symptoms of depression- irritability, change in appetite, anhedonia, sadness/tearfullness.  Cognitive assessment: the patient manages all their financial and personal affairs and is actively engaged.  The following portions of the patient's history were reviewed and updated as appropriate: allergies, current  medications, past family history, past medical history,  past surgical history, past social history  and problem list.  Vision, hearing, body mass index were assessed and reviewed.   During the course of the visit the patient was educated and counseled about appropriate screening and preventive services including : fall prevention , diabetes screening, nutrition counseling, colorectal cancer screening, and recommended immunizations.  Past Medical History  Diagnosis Date  . GERD (gastroesophageal reflux disease)   . History of chicken pox   . Synovial cyst of lumbar spine   . Sciatic pain    Past Surgical History  Procedure Laterality Date  . Tonsillectomy and adenoidectomy  1973   Family History  Problem Relation Age of Onset  . Arthritis Father    History   Social History  . Marital Status: Married    Spouse Name: N/A    Number of Children: N/A  . Years of Education: N/A   Occupational History  . Not on file.   Social History Main Topics  . Smoking status: Never Smoker   . Smokeless tobacco: Not on file  . Alcohol Use: Yes  . Drug Use: No  . Sexual Activity: Not on file   Other Topics Concern  . Not on file   Social History Narrative   6-7 hours of sleep a night   3 people living in the home    Drinks caffienated beverages    Exercise at least 3 times a week   Takes vitamins   Employed   Occupation -  Administrator - masters    married   Current Outpatient Prescriptions on File Prior to Visit  Medication Sig Dispense Refill  . meloxicam (MOBIC) 15 MG tablet TAKE 1 TABLET BY MOUTH ONCE DAILY  30 tablet  0  . triamcinolone (NASACORT AQ) 55 MCG/ACT nasal inhaler Place 2 sprays into the nose daily.  16.5 Inhaler  2   No current facility-administered medications on file prior to visit.     Review of Systems Constitutional:  Negative for fever, chills, activity change and unexpected weight change.  HEENT:  Negative for hearing loss, ear  pain, congestion, neck stiffness and postnasal drip. Negative for sore throat or swallowing problems. Negative for dental complaints. Occasional seasonal allergies treated with non-sedating antihistamine.    Eyes: Negative for vision loss or change in visual acuity.  Respiratory: Negative for chest tightness and wheezing. Negative for DOE.   Cardiovascular: Negative for chest pain or palpitations. No decreased exercise tolerance Gastrointestinal: No change in bowel habit. No bloating or gas. Occasional reflux. Genitourinary: Negative for urgency, frequency, flank pain and difficulty urinating.  Musculoskeletal: Negative for myalgias, Positive for lumbar cyst and sciatic pain.   Neurological: Negative for dizziness, tremors, weakness and headaches.  Hematological: Negative for adenopathy.  Psychiatric/Behavioral: Negative for behavioral problems and dysphoric mood.       Objective:   Physical Exam   Filed Vitals:   04/24/13 0917  BP: 114/90  Pulse: 60  Temp: 97 F (36.1 C)   Wt Readings from Last 3 Encounters:  04/24/13 204 lb (92.534 kg)  02/19/10 202 lb (91.627 kg)  06/01/06 201 lb (91.173 kg)   Gen'l: Well nourished well developed   male in no acute distress  HEENT: Head: Normocephalic and atraumatic. Right Ear: External ear normal. EAC/TM nl. Left Ear: External ear normal.  EAC/TM nl. Nose: Nose normal. Mouth/Throat: Oropharynx is clear and moist. Dentition - native, in good repair. No buccal or palatal lesions. Posterior pharynx clear. Eyes: Conjunctivae and sclera clear. EOM intact. Pupils are equal, round, and reactive to light. Right eye exhibits no discharge. Left eye exhibits no discharge. Neck: Normal range of motion. Neck supple. No JVD present. No tracheal deviation present. No thyromegaly present.  Cardiovascular: Normal rate, regular rhythm, no gallop, no friction rub, no murmur heard. Quiet precordium. 2+ radial and DP pulses . No carotid bruits Pulmonary/Chest: Effort  normal. No respiratory distress or increased WOB, no wheezes, no rales. No chest wall deformity or CVAT. Abdomen: Soft. Bowel sounds are normal in all quadrants. He exhibits no distension, no tenderness, no rebound or guarding, No heptosplenomegaly  Genitourinary:   Musculoskeletal: Normal range of motion. He exhibits no edema and no tenderness.      Full range of motion preserved about all small, median and large joints.  Lymphadenopathy: He has no cervical or supraclavicular adenopathy.  Neurological: He is alert and oriented to person, place, and time. CN II-XII intact. DTRs 2+ and symmetrical biceps, radial and patellar tendons. Cerebellar function normal with no tremor, rigidity, normal gait and station.  Skin: Skin is warm and dry. No rash noted. No erythema.  Psychiatric: He has a normal mood and affect. His behavior is normal. Thought content normal.   Recent Results (from the past 2160 hour(s))  BASIC METABOLIC PANEL     Status: None   Collection Time    04/24/13 10:56 AM      Result Value Ref Range   Sodium 138  135 - 145  mEq/L   Potassium 4.1  3.5 - 5.1 mEq/L   Chloride 105  96 - 112 mEq/L   CO2 27  19 - 32 mEq/L   Glucose, Bld 92  70 - 99 mg/dL   BUN 20  6 - 23 mg/dL   Creatinine, Ser 1.2  0.4 - 1.5 mg/dL   Calcium 8.9  8.4 - 10.5 mg/dL   GFR 66.99  >60.00 mL/min  TSH     Status: None   Collection Time    04/24/13 10:56 AM      Result Value Ref Range   TSH 1.90  0.35 - 5.50 uIU/mL  LIPID PANEL     Status: Abnormal   Collection Time    04/24/13 10:56 AM      Result Value Ref Range   Cholesterol 219 (*) 0 - 200 mg/dL   Comment: ATP III Classification       Desirable:  < 200 mg/dL               Borderline High:  200 - 239 mg/dL          High:  > = 240 mg/dL   Triglycerides 98.0  0.0 - 149.0 mg/dL   Comment: Normal:  <150 mg/dLBorderline High:  150 - 199 mg/dL   HDL 60.30  >39.00 mg/dL   VLDL 19.6  0.0 - 40.0 mg/dL   Total CHOL/HDL Ratio 4     Comment:                 Men          Women1/2 Average Risk     3.4          3.3Average Risk          5.0          4.42X Average Risk          9.6          7.13X Average Risk          15.0          11.0                      LDL CHOLESTEROL, DIRECT     Status: None   Collection Time    04/24/13 10:56 AM      Result Value Ref Range   Direct LDL 146.6     Comment: Optimal:  <100 mg/dLNear or Above Optimal:  100-129 mg/dLBorderline High:  130-159 mg/dLHigh:  160-189 mg/dLVery High:  >190 mg/dL      Assessment:     Per problem list    Plan:    Per problem list

## 2013-04-24 NOTE — Assessment & Plan Note (Signed)
Takes meloxicam 3 times a week. Well controlled.

## 2014-06-21 ENCOUNTER — Ambulatory Visit (INDEPENDENT_AMBULATORY_CARE_PROVIDER_SITE_OTHER): Payer: Managed Care, Other (non HMO)

## 2014-06-21 DIAGNOSIS — Z23 Encounter for immunization: Secondary | ICD-10-CM | POA: Diagnosis not present

## 2014-06-24 ENCOUNTER — Ambulatory Visit: Payer: Managed Care, Other (non HMO)

## 2014-06-24 LAB — TB SKIN TEST
Induration: 0 mm
TB Skin Test: NEGATIVE

## 2014-07-11 ENCOUNTER — Other Ambulatory Visit (INDEPENDENT_AMBULATORY_CARE_PROVIDER_SITE_OTHER): Payer: Managed Care, Other (non HMO)

## 2014-07-11 ENCOUNTER — Ambulatory Visit (INDEPENDENT_AMBULATORY_CARE_PROVIDER_SITE_OTHER): Payer: Managed Care, Other (non HMO) | Admitting: Internal Medicine

## 2014-07-11 ENCOUNTER — Encounter: Payer: Self-pay | Admitting: Internal Medicine

## 2014-07-11 VITALS — BP 132/90 | HR 69 | Temp 98.1°F | Resp 14 | Ht 71.0 in | Wt 209.4 lb

## 2014-07-11 DIAGNOSIS — Z23 Encounter for immunization: Secondary | ICD-10-CM | POA: Diagnosis not present

## 2014-07-11 DIAGNOSIS — Z299 Encounter for prophylactic measures, unspecified: Secondary | ICD-10-CM

## 2014-07-11 DIAGNOSIS — Z0000 Encounter for general adult medical examination without abnormal findings: Secondary | ICD-10-CM

## 2014-07-11 DIAGNOSIS — I10 Essential (primary) hypertension: Secondary | ICD-10-CM | POA: Insufficient documentation

## 2014-07-11 LAB — CBC
HCT: 47.2 % (ref 39.0–52.0)
Hemoglobin: 16.3 g/dL (ref 13.0–17.0)
MCHC: 34.6 g/dL (ref 30.0–36.0)
MCV: 90.9 fl (ref 78.0–100.0)
Platelets: 221 10*3/uL (ref 150.0–400.0)
RBC: 5.19 Mil/uL (ref 4.22–5.81)
RDW: 13.3 % (ref 11.5–15.5)
WBC: 6.9 10*3/uL (ref 4.0–10.5)

## 2014-07-11 LAB — COMPREHENSIVE METABOLIC PANEL
ALBUMIN: 4.3 g/dL (ref 3.5–5.2)
ALT: 18 U/L (ref 0–53)
AST: 16 U/L (ref 0–37)
Alkaline Phosphatase: 44 U/L (ref 39–117)
BUN: 17 mg/dL (ref 6–23)
CALCIUM: 9.6 mg/dL (ref 8.4–10.5)
CO2: 27 mEq/L (ref 19–32)
Chloride: 105 mEq/L (ref 96–112)
Creatinine, Ser: 1.23 mg/dL (ref 0.40–1.50)
GFR: 62.38 mL/min (ref >60.00)
GLUCOSE: 95 mg/dL (ref 70–99)
POTASSIUM: 4.5 meq/L (ref 3.5–5.1)
Sodium: 138 mEq/L (ref 135–145)
Total Bilirubin: 0.7 mg/dL (ref 0.2–1.2)
Total Protein: 6.5 g/dL (ref 6.0–8.3)

## 2014-07-11 LAB — LIPID PANEL
CHOL/HDL RATIO: 4
Cholesterol: 209 mg/dL — ABNORMAL HIGH (ref 0–200)
HDL: 55.9 mg/dL (ref >39.00)
LDL Cholesterol: 129 mg/dL — ABNORMAL HIGH (ref 0–99)
NONHDL: 153.1
TRIGLYCERIDES: 119 mg/dL (ref 0.0–149.0)
VLDL: 23.8 mg/dL (ref 0.0–40.0)

## 2014-07-11 LAB — PSA: PSA: 0.37 ng/mL (ref 0.10–4.00)

## 2014-07-11 MED ORDER — TELMISARTAN 40 MG PO TABS: 40.0000 mg | ORAL_TABLET | Freq: Every day | ORAL | Status: DC

## 2014-07-11 MED ORDER — MELOXICAM 15 MG PO TABS: 15.0000 mg | ORAL_TABLET | Freq: Every day | ORAL | Status: DC

## 2014-07-11 NOTE — Assessment & Plan Note (Signed)
BPs had been elevated at home and he is now taking telmisartan 40 mg daily for 2-3 months. Will check labs today and have rxed it for him. He is advised to work on diet and exercise since the weight gain coincides with his blood pressures increasing. Adjust regimen if labs abnormal.

## 2014-07-11 NOTE — Progress Notes (Signed)
Pre visit review using our clinic review tool, if applicable. No additional management support is needed unless otherwise documented below in the visit note. 

## 2014-07-11 NOTE — Patient Instructions (Signed)
We have given you the shingles shot today.   We will check on the blood work and call you back with the results (they will also be on mychart).  We have sent in the telmisartan 40 mg to [facility] and will keep you on it.   Come back in about 1 year so we can check on the blood pressure. You are due back for a colonoscopy this summer and if you do not hear back from their office please call and we will let them know to schedule you.   Health Maintenance A healthy lifestyle and preventative care can promote health and wellness.  Maintain regular health, dental, and eye exams.  Eat a healthy diet. Foods like vegetables, fruits, whole grains, low-fat dairy products, and lean protein foods contain the nutrients you need and are low in calories. Decrease your intake of foods high in solid fats, added sugars, and salt. Get information about a proper diet from your health care provider, if necessary.  Regular physical exercise is one of the most important things you can do for your health. Most adults should get at least 150 minutes of moderate-intensity exercise (any activity that increases your heart rate and causes you to sweat) each week. In addition, most adults need muscle-strengthening exercises on 2 or more days a week.   Maintain a healthy weight. The body mass index (BMI) is a screening tool to identify possible weight problems. It provides an estimate of body fat based on height and weight. Your health care provider can find your BMI and can help you achieve or maintain a healthy weight. For males 20 years and older:  A BMI below 18.5 is considered underweight.  A BMI of 18.5 to 24.9 is normal.  A BMI of 25 to 29.9 is considered overweight.  A BMI of 30 and above is considered obese.  Maintain normal blood lipids and cholesterol by exercising and minimizing your intake of saturated fat. Eat a balanced diet with plenty of fruits and vegetables. Blood tests for lipids and cholesterol  should begin at age 56 and be repeated every 5 years. If your lipid or cholesterol levels are high, you are over age 26, or you are at high risk for heart disease, you may need your cholesterol levels checked more frequently.Ongoing high lipid and cholesterol levels should be treated with medicines if diet and exercise are not working.  If you smoke, find out from your health care provider how to quit. If you do not use tobacco, do not start.  Lung cancer screening is recommended for adults aged 3-80 years who are at high risk for developing lung cancer because of a history of smoking. A yearly low-dose CT scan of the lungs is recommended for people who have at least a 30-pack-year history of smoking and are current smokers or have quit within the past 15 years. A pack year of smoking is smoking an average of 1 pack of cigarettes a day for 1 year (for example, a 30-pack-year history of smoking could mean smoking 1 pack a day for 30 years or 2 packs a day for 15 years). Yearly screening should continue until the smoker has stopped smoking for at least 15 years. Yearly screening should be stopped for people who develop a health problem that would prevent them from having lung cancer treatment.  If you choose to drink alcohol, do not have more than 2 drinks per day. One drink is considered to be 12 oz (360 mL)  of beer, 5 oz (150 mL) of wine, or 1.5 oz (45 mL) of liquor.  Avoid the use of street drugs. Do not share needles with anyone. Ask for help if you need support or instructions about stopping the use of drugs.  High blood pressure causes heart disease and increases the risk of stroke. Blood pressure should be checked at least every 1-2 years. Ongoing high blood pressure should be treated with medicines if weight loss and exercise are not effective.  If you are 55-70 years old, ask your health care provider if you should take aspirin to prevent heart disease.  Diabetes screening involves taking a  blood sample to check your fasting blood sugar level. This should be done once every 3 years after age 70 if you are at a normal weight and without risk factors for diabetes. Testing should be considered at a younger age or be carried out more frequently if you are overweight and have at least 1 risk factor for diabetes.  Colorectal cancer can be detected and often prevented. Most routine colorectal cancer screening begins at the age of 11 and continues through age 23. However, your health care provider may recommend screening at an earlier age if you have risk factors for colon cancer. On a yearly basis, your health care provider may provide home test kits to check for hidden blood in the stool. A small camera at the end of a tube may be used to directly examine the colon (sigmoidoscopy or colonoscopy) to detect the earliest forms of colorectal cancer. Talk to your health care provider about this at age 27 when routine screening begins. A direct exam of the colon should be repeated every 5-10 years through age 70, unless early forms of precancerous polyps or small growths are found.  People who are at an increased risk for hepatitis B should be screened for this virus. You are considered at high risk for hepatitis B if:  You were born in a country where hepatitis B occurs often. Talk with your health care provider about which countries are considered high risk.  Your parents were born in a high-risk country and you have not received a shot to protect against hepatitis B (hepatitis B vaccine).  You have HIV or AIDS.  You use needles to inject street drugs.  You live with, or have sex with, someone who has hepatitis B.  You are a man who has sex with other men (MSM).  You get hemodialysis treatment.  You take certain medicines for conditions like cancer, organ transplantation, and autoimmune conditions.  Hepatitis C blood testing is recommended for all people born from [birth year] through 1963 and any  individual with known risk factors for hepatitis C.  Healthy men should no longer receive prostate-specific antigen (PSA) blood tests as part of routine cancer screening. Talk to your health care provider about prostate cancer screening.  Testicular cancer screening is not recommended for adolescents or adult males who have no symptoms. Screening includes self-exam, a health care provider exam, and other screening tests. Consult with your health care provider about any symptoms you have or any concerns you have about testicular cancer.  Practice safe sex. Use condoms and avoid high-risk sexual practices to reduce the spread of sexually transmitted infections (STIs).  You should be screened for STIs, including gonorrhea and chlamydia if:  You are sexually active and are younger than 24 years.  You are older than 24 years, and your health care provider tells you that you  are at risk for this type of infection.  Your sexual activity has changed since you were last screened, and you are at an increased risk for chlamydia or gonorrhea. Ask your health care provider if you are at risk.  If you are at risk of being infected with HIV, it is recommended that you take a prescription medicine daily to prevent HIV infection. This is called pre-exposure prophylaxis (PrEP). You are considered at risk if:  You are a man who has sex with other men (MSM).  You are a heterosexual man who is sexually active with multiple partners.  You take drugs by injection.  You are sexually active with a partner who has HIV.  Talk with your health care provider about whether you are at high risk of being infected with HIV. If you choose to begin PrEP, you should first be tested for HIV. You should then be tested every 3 months for as long as you are taking PrEP.  Use sunscreen. Apply sunscreen liberally and repeatedly throughout the day. You should seek shade when your shadow is shorter than you. Protect yourself by  wearing long sleeves, pants, a wide-brimmed hat, and sunglasses year round whenever you are outdoors.  Tell your health care provider of new moles or changes in moles, especially if there is a change in shape or color. Also, tell your health care provider if a mole is larger than the size of a pencil eraser.  A one-time screening for abdominal aortic aneurysm (AAA) and surgical repair of large AAAs by ultrasound is recommended for men aged 21-75 years who are current or former smokers.  Stay current with your vaccines (immunizations). Document Released: 08/28/2007 Document Revised: 03/06/2013 Document Reviewed: 07/27/2010 [facility] Patient Information 2015 [place], [place]. This information is not intended to replace advice given to you by your health care provider. Make sure you discuss any questions you have with your health care provider.

## 2014-07-11 NOTE — Assessment & Plan Note (Signed)
Given shingles shot today, pneumonia series completed, flu shot yearly. Colonoscopy due this summer for 5 year follow up of adenomatous polyps. Talked with him about diet and exercise as ways to keep him heathy as well as his back pain to not come back. Checking labs today.

## 2014-07-11 NOTE — Progress Notes (Signed)
   Subjective:    Patient ID: Steven Hartman, male    DOB: 07-15-1945, 69 y.o.   MRN: [id number]  HPI The patient is a 69 YO man who is coming in for wellness. 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 He started taking telmisartan 80 mg daily and then noticed that his BP was running 90/60s and started taking 1/2 pill daily and now feels better. His blood pressure has been good when he has checked it at drug stores. He is not doing well with diet or exercise and has not been able to focus on weight loss since last visit.   PMH, [facility], social history reviewed and updated with the patient.   Review of Systems  Constitutional: Negative for fever, activity change, appetite change, fatigue and unexpected weight change.  HENT: Negative.   Eyes: Negative.   Respiratory: Negative for cough, chest tightness, shortness of breath and wheezing.   Cardiovascular: Negative for chest pain, palpitations and leg swelling.  Gastrointestinal: Negative for nausea, abdominal pain, diarrhea, constipation and abdominal distention.  Musculoskeletal: Negative.   Skin: Negative.   Neurological: Negative.   Psychiatric/Behavioral: Negative.       Objective:   Physical Exam  Constitutional: He is oriented to person, place, and time. He appears well-developed and well-nourished.  HENT:  Head: Normocephalic and atraumatic.  Eyes: EOM are normal.  Neck: Normal range of motion.  Cardiovascular: Normal rate and regular rhythm.   Carotids without bruits bilaterally  Pulmonary/Chest: Effort normal and breath sounds normal. No respiratory distress. He has no wheezes.  Abdominal: Soft. Bowel sounds are normal. He exhibits no distension. There is no tenderness. There is no rebound.  Musculoskeletal: He exhibits no edema.  Neurological: He is alert and oriented to person, place, and time.  Skin: Skin is warm and dry.   Filed Vitals:   07/11/14 0839  BP: 132/90   Pulse: 69  Temp: 98.1 F (36.7 C)  TempSrc: Oral  Resp: 14  Height: 5\' 11"  (1.803 m)  Weight: 209 lb 6.4 oz (94.983 kg)  SpO2: 97%      Assessment & Plan:  Shingles shot given at visit.

## 2014-10-29 ENCOUNTER — Encounter: Payer: Self-pay | Admitting: Internal Medicine

## 2014-11-19 ENCOUNTER — Encounter: Payer: Self-pay | Admitting: Internal Medicine

## 2015-06-11 ENCOUNTER — Encounter: Payer: Self-pay | Admitting: Internal Medicine

## 2015-07-30 ENCOUNTER — Encounter: Payer: Self-pay | Admitting: Internal Medicine

## 2015-07-30 ENCOUNTER — Ambulatory Visit (AMBULATORY_SURGERY_CENTER): Payer: Self-pay

## 2015-07-30 ENCOUNTER — Encounter: Payer: Managed Care, Other (non HMO) | Admitting: Internal Medicine

## 2015-07-30 VITALS — Ht 71.5 in | Wt 209.0 lb

## 2015-07-30 DIAGNOSIS — Z860100 Personal history of colon polyps, unspecified: Secondary | ICD-10-CM

## 2015-07-30 NOTE — Progress Notes (Signed)
No allergies to eggs or soy No past problems with anesthesia No diet meds No home oxygen  Has email and internet; declined emmi 

## 2015-08-06 ENCOUNTER — Encounter: Payer: Self-pay | Admitting: Internal Medicine

## 2015-08-06 ENCOUNTER — Ambulatory Visit (AMBULATORY_SURGERY_CENTER): Payer: Managed Care, Other (non HMO) | Admitting: Internal Medicine

## 2015-08-06 VITALS — BP 129/92 | HR 71 | Temp 97.7°F | Resp 16 | Ht 71.0 in | Wt 209.0 lb

## 2015-08-06 DIAGNOSIS — Z86010 Personal history of colonic polyps: Secondary | ICD-10-CM

## 2015-08-06 DIAGNOSIS — D122 Benign neoplasm of ascending colon: Secondary | ICD-10-CM

## 2015-08-06 DIAGNOSIS — D123 Benign neoplasm of transverse colon: Secondary | ICD-10-CM

## 2015-08-06 MED ORDER — SODIUM CHLORIDE 0.9 % IV SOLN: 500.0000 mL | INTRAVENOUS | Status: DC

## 2015-08-06 NOTE — Op Note (Signed)
[facility] Patient Name: Steven Hartman Procedure Date: 08/06/2015 9:56 AM MRN: [id number] Endoscopist: Gatha Mayer , MD Age: 70 Referring MD:  Date of Birth: Oct 21, 1945 Gender: Male Procedure:                Colonoscopy Indications:              Surveillance: Personal history of adenomatous                            polyps on last colonoscopy > 5 years ago Medicines:                Propofol per Anesthesia, Monitored Anesthesia Care Procedure:                Pre-Anesthesia Assessment:                           - Prior to the procedure, a History and Physical                            was performed, and patient medications and                            allergies were reviewed. The patient's tolerance of                            previous anesthesia was also reviewed. The risks                            and benefits of the procedure and the sedation                            options and risks were discussed with the patient.                            All questions were answered, and informed consent                            was obtained. Prior Anticoagulants: The patient                            last took previous NSAID medication 5 days prior to                            the procedure. ASA Grade Assessment: II - A patient                            with mild systemic disease. After reviewing the                            risks and benefits, the patient was deemed in                            satisfactory condition to undergo the procedure.  After obtaining informed consent, the colonoscope                            was passed under direct vision. Throughout the                            procedure, the patient's blood pressure, pulse, and                            oxygen saturations were monitored continuously. The                            Model CF-HQ190L [id number]) scope was introduced                            through  the anus and advanced to the the cecum,                            identified by appendiceal orifice and ileocecal                            valve. The colonoscopy was performed without                            difficulty. The patient tolerated the procedure                            well. The quality of the bowel preparation was                            excellent. The bowel preparation used was Miralax.                            The ileocecal valve, appendiceal orifice, and                            rectum were photographed. Scope In: 9:59:12 AM Scope Out: 10:12:52 AM Scope Withdrawal Time: 0 hours 11 minutes 7 seconds  Total Procedure Duration: 0 hours 13 minutes 40 seconds  Findings:                 Two sessile polyps were found in the transverse                            colon and ascending colon. The polyps were 4 to 5                            mm in size. These polyps were removed with a cold                            snare. Resection and retrieval were complete.                            Verification of patient identification for  the                            specimen was done. Estimated blood loss was minimal.                           Multiple large-mouthed diverticula were found in                            the sigmoid colon. There was no evidence of                            diverticular bleeding.                           The exam was otherwise without abnormality on                            direct and retroflexion views. Complications:            No immediate complications. Estimated Blood Loss:     Estimated blood loss was minimal. Impression:               - Two 4 to 5 mm polyps in the transverse colon and                            in the ascending colon, removed with a cold snare.                            Resected and retrieved.                           - Diverticulosis in the sigmoid colon. There was no                            evidence of  diverticular bleeding.                           - The examination was otherwise normal on direct                            and retroflexion views.                           - Personal history of colonic polyps. Recommendation:           - Patient has a contact number available for                            emergencies. The signs and symptoms of potential                            delayed complications were discussed with the                            patient. Return to normal activities tomorrow.  Written discharge instructions were provided to the                            patient.                           - Resume previous diet.                           - Continue present medications.                           - Repeat colonoscopy for surveillance based on                            pathology results. Gatha Mayer, MD 08/06/2015 10:26:26 AM This report has been signed electronically.

## 2015-08-06 NOTE — Progress Notes (Signed)
Called to room to assist during endoscopic procedure.  Patient ID and intended procedure confirmed with present staff. Received instructions for my participation in the procedure from the performing physician.  

## 2015-08-06 NOTE — Progress Notes (Signed)
First dose of propofol given at [id number]

## 2015-08-06 NOTE — Progress Notes (Signed)
A/ox3 pleased with MAC, report to Karen RN 

## 2015-08-06 NOTE — Patient Instructions (Addendum)
I found and removed two small polyps so I anticipate you coming back for another colonoscopy in 2022.  You also have a condition called diverticulosis - common and not usually a problem. Please read the handout provided.  I appreciate the opportunity to care for you. Gatha Mayer, MD, FACG   YOU HAD AN ENDOSCOPIC PROCEDURE TODAY AT [facility]:   Refer to the procedure report that was given to you for any specific questions about what was found during the examination.  If the procedure report does not answer your questions, please call your gastroenterologist to clarify.  If you requested that your care partner not be given the details of your procedure findings, then the procedure report has been included in a sealed envelope for you to review at your convenience later.  YOU SHOULD EXPECT: Some feelings of bloating in the abdomen. Passage of more gas than usual.  Walking can help get rid of the air that was put into your GI tract during the procedure and reduce the bloating. If you had a lower endoscopy (such as a colonoscopy or flexible sigmoidoscopy) you may notice spotting of blood in your stool or on the toilet paper. If you underwent a bowel prep for your procedure, you may not have a normal bowel movement for a few days.  Please Note:  You might notice some irritation and congestion in your nose or some drainage.  This is from the oxygen used during your procedure.  There is no need for concern and it should clear up in a day or so.  SYMPTOMS TO REPORT IMMEDIATELY:   Following lower endoscopy (colonoscopy or flexible sigmoidoscopy):  Excessive amounts of blood in the stool  Significant tenderness or worsening of abdominal pains  Swelling of the abdomen that is new, acute  Fever of 100F or higher   For urgent or emergent issues, a gastroenterologist can be reached at any hour by calling [phone].   DIET: Your first meal following the procedure  should be a small meal and then it is ok to progress to your normal diet. Heavy or fried foods are harder to digest and may make you feel nauseous or bloated.  Likewise, meals heavy in dairy and vegetables can increase bloating.  Drink plenty of fluids but you should avoid alcoholic beverages for 24 hours.  ACTIVITY:  You should plan to take it easy for the rest of today and you should NOT DRIVE or use heavy machinery until tomorrow (because of the sedation medicines used during the test).    FOLLOW UP: Our staff will call the number listed on your records the next business day following your procedure to check on you and address any questions or concerns that you may have regarding the information given to you following your procedure. If we do not reach you, we will leave a message.  However, if you are feeling well and you are not experiencing any problems, there is no need to return our call.  We will assume that you have returned to your regular daily activities without incident.  If any biopsies were taken you will be contacted by phone or by letter within the next 1-3 weeks.  Please call us at [phone] if you have not heard about the biopsies in 3 weeks.    SIGNATURES/CONFIDENTIALITY: You and/or your care partner have signed paperwork which will be entered into your electronic medical record.  These signatures attest to the fact that that  the information above on your After Visit Summary has been reviewed and is understood.  Full responsibility of the confidentiality of this discharge information lies with you and/or your care-partner.

## 2015-08-07 ENCOUNTER — Other Ambulatory Visit: Payer: Self-pay | Admitting: *Deleted

## 2015-08-07 ENCOUNTER — Telehealth: Payer: Self-pay | Admitting: *Deleted

## 2015-08-07 MED ORDER — TELMISARTAN 40 MG PO TABS: 40.0000 mg | ORAL_TABLET | Freq: Every day | ORAL | Status: DC

## 2015-08-07 NOTE — Telephone Encounter (Signed)
  Follow up Call-  Call back number 08/06/2015  Post procedure Call Back phone  # [phone]  Permission to leave phone message Yes     Patient questions:  Do you have a fever, pain , or abdominal swelling? No. Pain Score  0 *  Have you tolerated food without any problems? Yes.    Have you been able to return to your normal activities? Yes.    Do you have any questions about your discharge instructions: Diet   No. Medications  No. Follow up visit  No.  Do you have questions or concerns about your Care? No.  Actions: * If pain score is 4 or above: No action needed, pain <4.

## 2015-08-18 ENCOUNTER — Encounter: Payer: Self-pay | Admitting: Internal Medicine

## 2015-08-18 NOTE — Progress Notes (Signed)
Quick Note:  2 small adenomas - repeat colonoscopy 2022 ______

## 2015-10-10 ENCOUNTER — Other Ambulatory Visit: Payer: Self-pay | Admitting: *Deleted

## 2015-10-10 MED ORDER — MELOXICAM 15 MG PO TABS: 15.0000 mg | ORAL_TABLET | Freq: Every day | ORAL | 0 refills | Status: DC

## 2015-11-03 ENCOUNTER — Other Ambulatory Visit: Payer: Self-pay | Admitting: Internal Medicine

## 2016-01-15 ENCOUNTER — Other Ambulatory Visit: Payer: Self-pay | Admitting: Internal Medicine

## 2016-04-08 ENCOUNTER — Other Ambulatory Visit: Payer: Self-pay | Admitting: Internal Medicine

## 2016-05-13 ENCOUNTER — Other Ambulatory Visit: Payer: Self-pay | Admitting: Internal Medicine

## 2016-06-16 ENCOUNTER — Encounter: Payer: Self-pay | Admitting: Internal Medicine

## 2016-06-17 ENCOUNTER — Encounter: Payer: Self-pay | Admitting: Internal Medicine

## 2016-06-17 ENCOUNTER — Other Ambulatory Visit (INDEPENDENT_AMBULATORY_CARE_PROVIDER_SITE_OTHER): Payer: Managed Care, Other (non HMO)

## 2016-06-17 ENCOUNTER — Ambulatory Visit (INDEPENDENT_AMBULATORY_CARE_PROVIDER_SITE_OTHER): Payer: 59 | Admitting: Internal Medicine

## 2016-06-17 VITALS — BP 144/100 | HR 61 | Temp 97.6°F | Resp 14 | Ht 71.0 in | Wt 205.0 lb

## 2016-06-17 DIAGNOSIS — Z0000 Encounter for general adult medical examination without abnormal findings: Secondary | ICD-10-CM

## 2016-06-17 DIAGNOSIS — I10 Essential (primary) hypertension: Secondary | ICD-10-CM

## 2016-06-17 DIAGNOSIS — Z23 Encounter for immunization: Secondary | ICD-10-CM | POA: Diagnosis not present

## 2016-06-17 LAB — COMPREHENSIVE METABOLIC PANEL
ALBUMIN: 3.9 g/dL (ref 3.5–5.2)
ALT: 20 U/L (ref 0–53)
AST: 20 U/L (ref 0–37)
Alkaline Phosphatase: 40 U/L (ref 39–117)
BUN: 19 mg/dL (ref 6–23)
CALCIUM: 9.2 mg/dL (ref 8.4–10.5)
CHLORIDE: 106 meq/L (ref 96–112)
CO2: 29 mEq/L (ref 19–32)
CREATININE: 1.26 mg/dL (ref 0.40–1.50)
GFR: 60.32 mL/min (ref >60.00)
Glucose, Bld: 102 mg/dL — ABNORMAL HIGH (ref 70–99)
Potassium: 4.3 mEq/L (ref 3.5–5.1)
Sodium: 140 mEq/L (ref 135–145)
Total Bilirubin: 0.9 mg/dL (ref 0.2–1.2)
Total Protein: 6.3 g/dL (ref 6.0–8.3)

## 2016-06-17 LAB — HEMOGLOBIN A1C: Hgb A1c MFr Bld: 5.3 % (ref 4.6–6.5)

## 2016-06-17 LAB — CBC
HEMATOCRIT: 44.4 % (ref 39.0–52.0)
Hemoglobin: 15.2 g/dL (ref 13.0–17.0)
MCHC: 34.2 g/dL (ref 30.0–36.0)
MCV: 93.3 fl (ref 78.0–100.0)
PLATELETS: 185 10*3/uL (ref 150.0–400.0)
RBC: 4.76 Mil/uL (ref 4.22–5.81)
RDW: 13.6 % (ref 11.5–15.5)
WBC: 5.9 10*3/uL (ref 4.0–10.5)

## 2016-06-17 LAB — LIPID PANEL
Cholesterol: 204 mg/dL — ABNORMAL HIGH (ref 0–200)
HDL: 58.8 mg/dL (ref >39.00)
LDL CALC: 123 mg/dL — AB (ref 0–99)
NonHDL: 145.5
TRIGLYCERIDES: 114 mg/dL (ref 0.0–149.0)
Total CHOL/HDL Ratio: 3
VLDL: 22.8 mg/dL (ref 0.0–40.0)

## 2016-06-17 NOTE — Progress Notes (Signed)
   Subjective:    Patient ID: Steven Hartman, male    DOB: June 24, 1945, 71 y.o.   MRN: [id number]  HPI The patient is a 71 YO man coming in for wellness. No new concerns.  PMH, [facility], social history reviewed and updated.   Review of Systems  Constitutional: Negative.   HENT: Negative.   Eyes: Negative.   Respiratory: Negative for cough, chest tightness and shortness of breath.   Cardiovascular: Negative for chest pain, palpitations and leg swelling.  Gastrointestinal: Negative for abdominal distention, abdominal pain, constipation, diarrhea, nausea and vomiting.  Musculoskeletal: Negative.   Skin: Negative.   Neurological: Negative.   Psychiatric/Behavioral: Negative.       Objective:   Physical Exam  Constitutional: He is oriented to person, place, and time. He appears well-developed and well-nourished.  HENT:  Head: Normocephalic and atraumatic.  Eyes: EOM are normal.  Neck: Normal range of motion.  Cardiovascular: Normal rate and regular rhythm.   Pulmonary/Chest: Effort normal and breath sounds normal. No respiratory distress. He has no wheezes. He has no rales.  Abdominal: Soft. Bowel sounds are normal. He exhibits no distension. There is no tenderness. There is no rebound.  Musculoskeletal: He exhibits no edema.  Neurological: He is alert and oriented to person, place, and time. Coordination normal.  Skin: Skin is warm and dry.  Psychiatric: He has a normal mood and affect.   Vitals:   06/17/16 0905 06/17/16 1019  BP: (!) 142/100 (!) 144/100  Pulse: 61   Resp: 14   Temp: 97.6 F (36.4 C)   TempSrc: Oral   SpO2: 98%   Weight: 205 lb (93 kg)   Height: 5\' 11"  (1.803 m)       Assessment & Plan:  Pneumonia 23 given at visit.

## 2016-06-17 NOTE — Progress Notes (Signed)
Pre visit review using our clinic review tool, if applicable. No additional management support is needed unless otherwise documented below in the visit note. 

## 2016-06-17 NOTE — Assessment & Plan Note (Signed)
Colonoscopy due in 2022, Pneumonia 23 given to complete series. Missed flu shot this year but typically gets. Will do shingrix series this summer. Counseled abut sun safety and mole surveillance. Given screening recommendations.

## 2016-06-17 NOTE — Assessment & Plan Note (Signed)
Patient reports BP at goal when he checks at the gym. He will buy a home cuff and start checking at home. Taking his micardis but did not take this morning. Checking CMP and adjust as needed.

## 2016-06-17 NOTE — Patient Instructions (Signed)
We are checking the labs today and will send the results on mychart.   We have given you the pneumonia shot today.   You can schedule a nurse visit to start the shingrix (shingles shot) which is 2 shots anytime 2 weeks or later.    Health Maintenance, Male A healthy lifestyle and preventive care is important for your health and wellness. Ask your health care provider about what schedule of regular examinations is right for you. What should I know about weight and diet?  Eat a Healthy Diet  Eat plenty of vegetables, fruits, whole grains, low-fat dairy products, and lean protein.  Do not eat a lot of foods high in solid fats, added sugars, or salt. Maintain a Healthy Weight  Regular exercise can help you achieve or maintain a healthy weight. You should:  Do at least 150 minutes of exercise each week. The exercise should increase your heart rate and make you sweat (moderate-intensity exercise).  Do strength-training exercises at least twice a week. Watch Your Levels of Cholesterol and Blood Lipids  Have your blood tested for lipids and cholesterol every 5 years starting at 69 years of age. If you are at high risk for heart disease, you should start having your blood tested when you are 69 years old. You may need to have your cholesterol levels checked more often if:  Your lipid or cholesterol levels are high.  You are older than 69 years of age.  You are at high risk for heart disease. What should I know about cancer screening? Many types of cancers can be detected early and may often be prevented. Lung Cancer  You should be screened every year for lung cancer if:  You are a current smoker who has smoked for at least 30 years.  You are a former smoker who has quit within the past 15 years.  Talk to your health care provider about your screening options, when you should start screening, and how often you should be screened. Colorectal Cancer  Routine colorectal cancer screening  usually begins at 69 years of age and should be repeated every 5-10 years until you are 69 years old. You may need to be screened more often if early forms of precancerous polyps or small growths are found. Your health care provider may recommend screening at an earlier age if you have risk factors for colon cancer.  Your health care provider may recommend using home test kits to check for hidden blood in the stool.  A small camera at the end of a tube can be used to examine your colon (sigmoidoscopy or colonoscopy). This checks for the earliest forms of colorectal cancer. Prostate and Testicular Cancer  Depending on your age and overall health, your health care provider may do certain tests to screen for prostate and testicular cancer.  Talk to your health care provider about any symptoms or concerns you have about testicular or prostate cancer. Skin Cancer  Check your skin from head to toe regularly.  Tell your health care provider about any new moles or changes in moles, especially if:  There is a change in a mole's size, shape, or color.  You have a mole that is larger than a pencil eraser.  Always use sunscreen. Apply sunscreen liberally and repeat throughout the day.  Protect yourself by wearing long sleeves, pants, a wide-brimmed hat, and sunglasses when outside. What should I know about heart disease, diabetes, and high blood pressure?  If you are 18-39 years of  age, have your blood pressure checked every 3-5 years. If you are 31 years of age or older, have your blood pressure checked every year. You should have your blood pressure measured twice-once when you are at a hospital or clinic, and once when you are not at a hospital or clinic. Record the average of the two measurements. To check your blood pressure when you are not at a hospital or clinic, you can use:  An automated blood pressure machine at a pharmacy.  A home blood pressure monitor.  Talk to your health care  provider about your target blood pressure.  If you are between 69-35 years old, ask your health care provider if you should take aspirin to prevent heart disease.  Have regular diabetes screenings by checking your fasting blood sugar level.  If you are at a normal weight and have a low risk for diabetes, have this test once every three years after the age of 35.  If you are overweight and have a high risk for diabetes, consider being tested at a younger age or more often.  A one-time screening for abdominal aortic aneurysm (AAA) by ultrasound is recommended for men aged 78-75 years who are current or former smokers. What should I know about preventing infection? Hepatitis B  If you have a higher risk for hepatitis B, you should be screened for this virus. Talk with your health care provider to find out if you are at risk for hepatitis B infection. Hepatitis C  Blood testing is recommended for:  Everyone born from [birth year] through 1963.  Anyone with known risk factors for hepatitis C. Sexually Transmitted Diseases (STDs)  You should be screened each year for STDs including gonorrhea and chlamydia if:  You are sexually active and are younger than 69 years of age.  You are older than 69 years of age and your health care provider tells you that you are at risk for this type of infection.  Your sexual activity has changed since you were last screened and you are at an increased risk for chlamydia or gonorrhea. Ask your health care provider if you are at risk.  Talk with your health care provider about whether you are at high risk of being infected with HIV. Your health care provider may recommend a prescription medicine to help prevent HIV infection. What else can I do?  Schedule regular health, dental, and eye exams.  Stay current with your vaccines (immunizations).  Do not use any tobacco products, such as cigarettes, chewing tobacco, and e-cigarettes. If you need help quitting, ask  your health care provider.  Limit alcohol intake to no more than 2 drinks per day. One drink equals 12 ounces of beer, 5 ounces of wine, or 1 ounces of hard liquor.  Do not use street drugs.  Do not share needles.  Ask your health care provider for help if you need support or information about quitting drugs.  Tell your health care provider if you often feel depressed.  Tell your health care provider if you have ever been abused or do not feel safe at home. This information is not intended to replace advice given to you by your health care provider. Make sure you discuss any questions you have with your health care provider. Document Released: 08/28/2007 Document Revised: 10/29/2015 Document Reviewed: 12/03/2014 Elsevier Interactive Patient Education  2017 [employer].

## 2016-06-18 ENCOUNTER — Encounter: Payer: Self-pay | Admitting: Internal Medicine

## 2016-06-18 DIAGNOSIS — Z8042 Family history of malignant neoplasm of prostate: Secondary | ICD-10-CM

## 2016-06-22 ENCOUNTER — Encounter: Payer: Self-pay | Admitting: Internal Medicine

## 2016-06-22 ENCOUNTER — Other Ambulatory Visit: Payer: Self-pay | Admitting: Internal Medicine

## 2016-06-22 NOTE — Telephone Encounter (Signed)
filled

## 2016-06-22 NOTE — Telephone Encounter (Signed)
Yes, can fill for 90 days with refills

## 2016-06-22 NOTE — Telephone Encounter (Signed)
Is this ok to refill, Please advise

## 2016-07-13 ENCOUNTER — Encounter: Payer: Self-pay | Admitting: Internal Medicine

## 2016-09-06 ENCOUNTER — Other Ambulatory Visit (INDEPENDENT_AMBULATORY_CARE_PROVIDER_SITE_OTHER): Payer: Managed Care, Other (non HMO)

## 2016-09-06 DIAGNOSIS — Z8042 Family history of malignant neoplasm of prostate: Secondary | ICD-10-CM | POA: Diagnosis not present

## 2016-09-06 LAB — PSA: PSA: 0.47 ng/mL (ref 0.10–4.00)

## 2016-10-08 ENCOUNTER — Other Ambulatory Visit: Payer: Self-pay | Admitting: Internal Medicine

## 2016-12-15 ENCOUNTER — Other Ambulatory Visit: Payer: Self-pay | Admitting: Family

## 2016-12-23 ENCOUNTER — Ambulatory Visit (INDEPENDENT_AMBULATORY_CARE_PROVIDER_SITE_OTHER): Payer: 59 | Admitting: General Practice

## 2016-12-23 DIAGNOSIS — Z23 Encounter for immunization: Secondary | ICD-10-CM | POA: Diagnosis not present

## 2017-07-02 ENCOUNTER — Other Ambulatory Visit: Payer: Self-pay | Admitting: Internal Medicine

## 2017-08-24 ENCOUNTER — Ambulatory Visit: Payer: 59 | Admitting: Internal Medicine

## 2017-08-24 ENCOUNTER — Other Ambulatory Visit (INDEPENDENT_AMBULATORY_CARE_PROVIDER_SITE_OTHER): Payer: 59

## 2017-08-24 ENCOUNTER — Encounter: Payer: Self-pay | Admitting: Internal Medicine

## 2017-08-24 ENCOUNTER — Ambulatory Visit (INDEPENDENT_AMBULATORY_CARE_PROVIDER_SITE_OTHER): Payer: 59 | Admitting: Internal Medicine

## 2017-08-24 VITALS — BP 150/100 | HR 57 | Temp 98.1°F | Resp 16 | Ht 71.0 in | Wt 199.0 lb

## 2017-08-24 DIAGNOSIS — S30861A Insect bite (nonvenomous) of abdominal wall, initial encounter: Secondary | ICD-10-CM | POA: Diagnosis not present

## 2017-08-24 DIAGNOSIS — I10 Essential (primary) hypertension: Secondary | ICD-10-CM

## 2017-08-24 DIAGNOSIS — W57XXXA Bitten or stung by nonvenomous insect and other nonvenomous arthropods, initial encounter: Secondary | ICD-10-CM | POA: Diagnosis not present

## 2017-08-24 DIAGNOSIS — A6920 Lyme disease, unspecified: Secondary | ICD-10-CM

## 2017-08-24 DIAGNOSIS — E559 Vitamin D deficiency, unspecified: Secondary | ICD-10-CM | POA: Diagnosis not present

## 2017-08-24 DIAGNOSIS — Z1159 Encounter for screening for other viral diseases: Secondary | ICD-10-CM

## 2017-08-24 DIAGNOSIS — N5201 Erectile dysfunction due to arterial insufficiency: Secondary | ICD-10-CM | POA: Insufficient documentation

## 2017-08-24 MED ORDER — DOXYCYCLINE HYCLATE 100 MG PO TBEC: 100.0000 mg | DELAYED_RELEASE_TABLET | Freq: Two times a day (BID) | ORAL | 0 refills | Status: AC

## 2017-08-24 MED ORDER — TELMISARTAN 80 MG PO TABS: 80.0000 mg | ORAL_TABLET | Freq: Every day | ORAL | 0 refills | Status: DC

## 2017-08-24 MED ORDER — SILDENAFIL CITRATE 20 MG PO TABS: 80.0000 mg | ORAL_TABLET | Freq: Every day | ORAL | 11 refills | Status: DC | PRN

## 2017-08-24 NOTE — Progress Notes (Signed)
Subjective:  Patient ID: Steven Hartman, male    DOB: 1946/02/06  Age: 72 y.o. MRN: [id number]  CC: Rash and Hypertension   HPI Steven Hartman presents for concerns about his blood pressure, recent tick bites, and rash.  He has had at least 3 or 4 tick bites over the last 3 weeks.  Some of the ticks have been black ticks, some of them have been brown dog ticks and some have been small deer ticks.  He has noticed at the site of one tick bite on the left thigh there is a red mark.  There are also several red marks over his left flank.  These areas itch mildly.  He is controlling the itching with topical hydrocortisone.  He has had a few joint aches recently but he denies headache, fever, chills, or night sweats.  Outpatient Medications Prior to Visit  Medication Sig Dispense Refill  . fexofenadine (ALLEGRA) 180 MG tablet Take 180 mg by mouth daily.    . meloxicam (MOBIC) 15 MG tablet TAKE 1 TABLET BY MOUTH DAILY 30 tablet 6  . triamcinolone (NASACORT ALLERGY 24HR) 55 MCG/ACT AERO nasal inhaler Place 2 sprays into the nose as needed.    Mar[place] Kitchen MICARDIS 40 MG tablet Take 1 tablet (40 mg total) by mouth daily. Need annual visit for further refills 90 tablet 0  . triamcinolone (NASACORT AQ) 55 MCG/ACT nasal inhaler Place 2 sprays into the nose daily. 16.5 Inhaler 2   No facility-administered medications prior to visit.     ROS Review of Systems  Constitutional: Negative for chills, diaphoresis, fatigue and fever.  HENT: Negative.  Negative for sore throat.   Eyes: Negative for visual disturbance.  Respiratory: Negative for cough, chest tightness and shortness of breath.   Cardiovascular: Negative for chest pain, palpitations and leg swelling.  Gastrointestinal: Negative for abdominal pain, constipation and diarrhea.  Genitourinary: Negative.  Negative for difficulty urinating.       +ED  Musculoskeletal: Positive for arthralgias. Negative for back pain, myalgias, neck pain and neck  stiffness.  Skin: Positive for color change and rash. Negative for pallor and wound.  Neurological: Negative for dizziness, weakness and headaches.  Hematological: Negative for adenopathy. Does not bruise/bleed easily.  Psychiatric/Behavioral: Negative.     Objective:  BP (!) 150/100 (BP Location: Left Arm, Patient Position: Sitting, Cuff Size: Large)   Pulse (!) 57   Temp 98.1 F (36.7 C) (Oral)   Resp 16   Ht 5\' 11"  (1.803 m)   Wt 199 lb (90.3 kg)   SpO2 97%   BMI 27.75 kg/m   BP Readings from Last 3 Encounters:  08/24/17 (!) 150/100  06/17/16 (!) 144/100  08/06/15 (!) 129/92    Wt Readings from Last 3 Encounters:  08/24/17 199 lb (90.3 kg)  06/17/16 205 lb (93 kg)  08/06/15 209 lb (94.8 kg)    Physical Exam  Constitutional: He is oriented to person, place, and time. No distress.  HENT:  Mouth/Throat: Oropharynx is clear and moist. No oropharyngeal exudate.  Eyes: Conjunctivae are normal. No scleral icterus.  Neck: Normal range of motion. Neck supple.  Cardiovascular: Normal rate, regular rhythm and normal heart sounds.  No murmur heard. Pulmonary/Chest: Effort normal and breath sounds normal. He has no wheezes. He has no rales.  Abdominal: Normal appearance and bowel sounds are normal. There is no hepatosplenomegaly. There is no tenderness.  Musculoskeletal: Normal range of motion. He exhibits no edema or deformity.  Lymphadenopathy:  He has no cervical adenopathy.  Neurological: He is alert and oriented to person, place, and time.  Skin: He is not diaphoretic.  Over the left, mid medial thigh.  There is an erythematous, round, macule that is confluent with no central clearing.  It measures about 1.5 cm/the edges are very well demarcated.  Over the left mid flank there are 3 roundish distinct lesions.  These are larger erythematous macules with central clearing.  The edges are not very well demarcated but there is no induration, fluctuance, or streaking.  These  measures approximately 3 to 3-1/2 cm.  Vitals reviewed.   Lab Results  Component Value Date   WBC 7.5 08/24/2017   HGB 15.9 08/24/2017   HCT 46.5 08/24/2017   PLT 233.0 08/24/2017   GLUCOSE 95 08/24/2017   CHOL 204 (H) 06/17/2016   TRIG 114.0 06/17/2016   HDL 58.80 06/17/2016   LDLDIRECT 146.6 04/24/2013   LDLCALC 123 (H) 06/17/2016   ALT 23 08/24/2017   AST 20 08/24/2017   NA 140 08/24/2017   K 4.7 08/24/2017   CL 106 08/24/2017   CREATININE 1.30 08/24/2017   BUN 25 (H) 08/24/2017   CO2 26 08/24/2017   TSH 1.69 08/24/2017   PSA 0.47 09/06/2016   HGBA1C 5.3 06/17/2016    No results found.  Assessment & Plan:   Azlaan was seen today for rash and hypertension.  Diagnoses and all orders for this visit:  Essential hypertension- His blood pressure is not adequately well controlled.  His lab work is remarkable for mild vitamin D deficiency but otherwise there is no evidence of secondary causes or endorgan damage.  I will treat the vitamin D deficiency and will increase the dose of his ARB. -     CBC with Differential/Platelet; Future -     Urinalysis, Routine w reflex microscopic; Future -     TSH; Future -     VITAMIN D 25 Hydroxy (Vit-D Deficiency, Fractures); Future -     Comprehensive metabolic panel; Future -     telmisartan (MICARDIS) 80 MG tablet; Take 1 tablet (80 mg total) by mouth daily.  ECM (erythema chronicum migrans)- His history and rash are suspicious for early Lyme.  His lab work is only remarkable for a mild increase in monocytes.  Will treat with a 3-week course of doxycycline. -     B. burgdorfi antibodies by WB; Future -     Sedimentation rate; Future -     Comprehensive metabolic panel; Future -     doxycycline (DORYX) 100 MG EC tablet; Take 1 tablet (100 mg total) by mouth 2 (two) times daily for 21 days.  Tick bite of abdominal wall, initial encounter- His LFTs are normal, his CBC is only remarkable for mild monocytosis/his white cell count is  normal.  I will screen for Lyme disease and will empirically treat for tickborne illness with doxycycline. -     B. burgdorfi antibodies by WB; Future -     Sedimentation rate; Future -     Comprehensive metabolic panel; Future -     doxycycline (DORYX) 100 MG EC tablet; Take 1 tablet (100 mg total) by mouth 2 (two) times daily for 21 days.  Need for hepatitis C screening test -     Hepatitis C antibody; Future  Erectile dysfunction due to arterial insufficiency -     sildenafil (REVATIO) 20 MG tablet; Take 4 tablets (80 mg total) by mouth daily as needed.  Vitamin D deficiency -  Cholecalciferol 2000 units TABS; Take 1 tablet (2,000 Units total) by mouth daily.   I have discontinued [place]. I am also having him start on telmisartan, doxycycline, sildenafil, and Cholecalciferol. Additionally, I am having him maintain his fexofenadine, triamcinolone, and meloxicam.  Meds ordered this encounter  Medications  . telmisartan (MICARDIS) 80 MG tablet    Sig: Take 1 tablet (80 mg total) by mouth daily.    Dispense:  90 tablet    Refill:  0  . doxycycline (DORYX) 100 MG EC tablet    Sig: Take 1 tablet (100 mg total) by mouth 2 (two) times daily for 21 days.    Dispense:  42 tablet    Refill:  0  . sildenafil (REVATIO) 20 MG tablet    Sig: Take 4 tablets (80 mg total) by mouth daily as needed.    Dispense:  60 tablet    Refill:  11  . Cholecalciferol 2000 units TABS    Sig: Take 1 tablet (2,000 Units total) by mouth daily.    Dispense:  90 tablet    Refill:  1     Follow-up: Return in about 1 month (around 09/21/2017).  Scarlette Calico, MD

## 2017-08-24 NOTE — Patient Instructions (Signed)

## 2017-08-25 DIAGNOSIS — E559 Vitamin D deficiency, unspecified: Secondary | ICD-10-CM | POA: Insufficient documentation

## 2017-08-25 LAB — COMPREHENSIVE METABOLIC PANEL
ALBUMIN: 4.2 g/dL (ref 3.5–5.2)
ALK PHOS: 47 U/L (ref 39–117)
ALT: 23 U/L (ref 0–53)
AST: 20 U/L (ref 0–37)
BILIRUBIN TOTAL: 0.5 mg/dL (ref 0.2–1.2)
BUN: 25 mg/dL — ABNORMAL HIGH (ref 6–23)
CALCIUM: 9.4 mg/dL (ref 8.4–10.5)
CO2: 26 mEq/L (ref 19–32)
Chloride: 106 mEq/L (ref 96–112)
Creatinine, Ser: 1.3 mg/dL (ref 0.40–1.50)
GFR: 57.98 mL/min — AB (ref >60.00)
Glucose, Bld: 95 mg/dL (ref 70–99)
POTASSIUM: 4.7 meq/L (ref 3.5–5.1)
Sodium: 140 mEq/L (ref 135–145)
TOTAL PROTEIN: 6.8 g/dL (ref 6.0–8.3)

## 2017-08-25 LAB — URINALYSIS, ROUTINE W REFLEX MICROSCOPIC
Bilirubin Urine: NEGATIVE
HGB URINE DIPSTICK: NEGATIVE
Ketones, ur: NEGATIVE
Leukocytes, UA: NEGATIVE
NITRITE: NEGATIVE
RBC / HPF: NONE SEEN (ref 0–?)
Specific Gravity, Urine: <=1.005 — AB (ref 1.000–1.030)
TOTAL PROTEIN, URINE-UPE24: NEGATIVE
Urine Glucose: NEGATIVE
Urobilinogen, UA: 0.2 (ref 0.0–1.0)
pH: 6.5 (ref 5.0–8.0)

## 2017-08-25 LAB — SEDIMENTATION RATE: SED RATE: 8 mm/h (ref 0–20)

## 2017-08-25 LAB — CBC WITH DIFFERENTIAL/PLATELET
Basophils Absolute: 0.1 10*3/uL (ref 0.0–0.1)
Basophils Relative: 0.9 % (ref 0.0–3.0)
EOS PCT: 6.1 % — AB (ref 0.0–5.0)
Eosinophils Absolute: 0.5 10*3/uL (ref 0.0–0.7)
HCT: 46.5 % (ref 39.0–52.0)
Hemoglobin: 15.9 g/dL (ref 13.0–17.0)
LYMPHS ABS: 1.7 10*3/uL (ref 0.7–4.0)
Lymphocytes Relative: 21.9 % (ref 12.0–46.0)
MCHC: 34.2 g/dL (ref 30.0–36.0)
MCV: 93.7 fl (ref 78.0–100.0)
MONOS PCT: 12.7 % — AB (ref 3.0–12.0)
Monocytes Absolute: 1 10*3/uL (ref 0.1–1.0)
NEUTROS PCT: 58.4 % (ref 43.0–77.0)
Neutro Abs: 4.4 10*3/uL (ref 1.4–7.7)
Platelets: 233 10*3/uL (ref 150.0–400.0)
RBC: 4.97 Mil/uL (ref 4.22–5.81)
RDW: 13.4 % (ref 11.5–15.5)
WBC: 7.5 10*3/uL (ref 4.0–10.5)

## 2017-08-25 LAB — TSH: TSH: 1.69 u[IU]/mL (ref 0.35–4.50)

## 2017-08-25 LAB — VITAMIN D 25 HYDROXY (VIT D DEFICIENCY, FRACTURES): VITD: 25.26 ng/mL — ABNORMAL LOW (ref 30.00–100.00)

## 2017-08-25 MED ORDER — CHOLECALCIFEROL 50 MCG (2000 UT) PO TABS: 1.0000 | ORAL_TABLET | Freq: Every day | ORAL | 1 refills | Status: DC

## 2017-08-26 ENCOUNTER — Encounter: Payer: Self-pay | Admitting: Internal Medicine

## 2017-08-26 LAB — B. BURGDORFI ANTIBODIES BY WB
B burgdorferi IgG Abs (IB): NEGATIVE
B burgdorferi IgM Abs (IB): NEGATIVE
LYME DISEASE 23 KD IGG: NONREACTIVE
LYME DISEASE 41 KD IGG: NONREACTIVE
LYME DISEASE 66 KD IGG: NONREACTIVE
Lyme Disease 18 kD IgG: NONREACTIVE
Lyme Disease 23 kD IgM: NONREACTIVE
Lyme Disease 28 kD IgG: NONREACTIVE
Lyme Disease 30 kD IgG: NONREACTIVE
Lyme Disease 39 kD IgG: NONREACTIVE
Lyme Disease 39 kD IgM: NONREACTIVE
Lyme Disease 41 kD IgM: NONREACTIVE
Lyme Disease 45 kD IgG: NONREACTIVE
Lyme Disease 58 kD IgG: NONREACTIVE
Lyme Disease 93 kD IgG: NONREACTIVE

## 2017-08-26 LAB — HEPATITIS C ANTIBODY
HEP C AB: NONREACTIVE
SIGNAL TO CUT-OFF: 0.04 (ref <1.00)

## 2017-10-05 ENCOUNTER — Encounter (HOSPITAL_COMMUNITY): Payer: Self-pay | Admitting: Emergency Medicine

## 2017-10-05 ENCOUNTER — Ambulatory Visit (HOSPITAL_COMMUNITY)
Admission: EM | Admit: 2017-10-05 | Discharge: 2017-10-05 | Disposition: A | Discharge status: Home / Self Care | Payer: 59 | Attending: Family Medicine | Admitting: Family Medicine

## 2017-10-05 ENCOUNTER — Other Ambulatory Visit: Payer: Self-pay

## 2017-10-05 DIAGNOSIS — R21 Rash and other nonspecific skin eruption: Secondary | ICD-10-CM | POA: Diagnosis not present

## 2017-10-05 MED ORDER — PREDNISONE 10 MG (21) PO TBPK: ORAL_TABLET | ORAL | 0 refills | Status: DC

## 2017-10-05 NOTE — Discharge Instructions (Signed)
It was nice meeting you!!  We will treat you with a short course of steroids to see if this helps.  Like we discussed this does not appear to be Lyme disease or need antibiotics at this time. Please monitor the rash for worsening symptoms. More painful, blisters, ulcers and spreading. Of this happens please go to the ER.  If you develop fatigue,fever, body aches or headaches return for recheck.

## 2017-10-05 NOTE — ED Triage Notes (Signed)
Pt removed a tick from his right upper posterior leg on Sunday.  Pt has since developed a rash on his upper legs bilaterally.  Pt states in the past he had a few tick bites and was treated for possible Lime's Disease, but his lab results came back negative. He states they were unable to identify his rash at that time.

## 2017-10-05 NOTE — ED Provider Notes (Signed)
Steven Hartman    CSN: [id number] Arrival date & time: 10/05/17  1527     History   Chief Complaint Chief Complaint  Patient presents with  . Tick Removal    right leg  . Rash    bilateral legs    HPI Steven Hartman is a 72 y.o. male.   Patient is a 72 year old male with past medical history of tick bite, essential hypertension, GERD, sciatic pain.  He presents today with 3 days of rash to lower extremities.  This rash appeared after he removed a tick from the back of his right lower extremity behind the knee.  The rash has become worse since to the other leg.  He has been using steroid cream for relief of symptoms.  He reports that some of the spots are itchy but not painful.  He denies any fever, chills, fatigue, headaches, body aches, dizziness.   Patient was also seen on 08/24/2017 for similar rash after tick bite.  He was treated prophylactically with doxycycline and about 2 weeks later the rash disappeared.  He also had lab work done to test for Lyme's disease which was negative.  He had used the same steroid cream with the last rash that helped.  ROS per HPI          Past Medical History:  Diagnosis Date  . GERD (gastroesophageal reflux disease)   . History of chicken pox   . Sciatic pain   . Synovial cyst of lumbar spine     Patient Active Problem List   Diagnosis Date Noted  . Vitamin D deficiency 08/25/2017  . ECM (erythema chronicum migrans) 08/24/2017  . Tick bite of abdominal wall 08/24/2017  . Need for hepatitis C screening test 08/24/2017  . Erectile dysfunction due to arterial insufficiency 08/24/2017  . Essential hypertension 07/11/2014  . Routine general medical examination at a health care facility 04/24/2013  . Personal history of colonic polyps 09/30/2009  . BACK PAIN WITH RADICULOPATHY 05/19/2007    Past Surgical History:  Procedure Laterality Date  . COLONOSCOPY  multiple  . TONSILLECTOMY AND ADENOIDECTOMY  1973        Home Medications    Prior to Admission medications   Medication Sig Start Date End Date Taking? Authorizing Provider  Cholecalciferol 2000 units TABS Take 1 tablet (2,000 Units total) by mouth daily. 08/25/17  Yes Janith Lima, MD  fexofenadine (ALLEGRA) 180 MG tablet Take 180 mg by mouth daily.   Yes [provider]  meloxicam (MOBIC) 15 MG tablet TAKE 1 TABLET BY MOUTH DAILY 12/16/16  Yes Hoyt Koch, MD  sildenafil (REVATIO) 20 MG tablet Take 4 tablets (80 mg total) by mouth daily as needed. 08/24/17  Yes Janith Lima, MD  telmisartan (MICARDIS) 80 MG tablet Take 1 tablet (80 mg total) by mouth daily. 08/24/17  Yes Janith Lima, MD  triamcinolone (NASACORT ALLERGY 24HR) 55 MCG/ACT AERO nasal inhaler Place 2 sprays into the nose as needed.   Yes [provider]  predniSONE (STERAPRED UNI-PAK 21 TAB) 10 MG (21) TBPK tablet 6 tabs for 1 day, then 5 tabs for 1 das, then 4 tabs for 1 day, then 3 tabs for 1 day, 2 tabs for 1 day, then 1 tab for 1 day 10/05/17   Orvan July, NP    Family History Family History  Problem Relation Age of Onset  . Arthritis Father   . Parkinson's disease Mother 71  . Prostate cancer  Brother   . Colon cancer Neg Hx     Social History Social History   Tobacco Use  . Smoking status: Never Smoker  . Smokeless tobacco: Never Used  Substance Use Topics  . Alcohol use: Yes    Alcohol/week: 3.0 oz    Types: 5 Standard drinks or equivalent per week    Comment: Wine  . Drug use: No     Allergies   Patient has no known allergies.   Review of Systems Review of Systems   Physical Exam Triage Vital Signs ED Triage Vitals  Enc Vitals Group     BP 10/05/17 1548 121/86     Pulse Rate 10/05/17 1548 76     Resp 10/05/17 1548 16     Temp 10/05/17 1548 98.1 F (36.7 C)     Temp Source 10/05/17 1548 Oral     SpO2 10/05/17 1548 98 %     Weight --      Height --      Head Circumference --      Peak Flow --       Pain Score 10/05/17 1554 0     Pain Loc --      Pain Edu? --      Excl. in [place]? --    No data found.  Updated Vital Signs BP 121/86 (BP Location: Left Arm)   Pulse 76   Temp 98.1 F (36.7 C) (Oral)   Resp 16   SpO2 98%   Visual Acuity Right Eye Distance:   Left Eye Distance:   Bilateral Distance:    Right Eye Near:   Left Eye Near:    Bilateral Near:     Physical Exam  Constitutional: He is oriented to person, place, and time. He appears well-developed and well-nourished.  HENT:  Head: Normocephalic and atraumatic.  Neck: Normal range of motion. Neck supple.  Cardiovascular: Normal rate and regular rhythm.  Pulmonary/Chest: Effort normal and breath sounds normal.  Musculoskeletal: Normal range of motion.  Lymphadenopathy:    He has no cervical adenopathy.  Neurological: He is alert and oriented to person, place, and time.  Skin: Skin is warm and dry. Capillary refill takes less than 2 seconds.  Multiple erythematous macules some as big as 2 cm and some with central clearing. No warmth to touch, blanchable. Non tender to touch.   Psychiatric: He has a normal mood and affect.  Nursing note and vitals reviewed.        UC Treatments / Results  Labs (all labs ordered are listed, but only abnormal results are displayed) Labs Reviewed - No data to display  EKG None  Radiology No results found.  Procedures Procedures (including critical care time)  Medications Ordered in UC Medications - No data to display  Initial Impression / Assessment and Plan / UC Course  I have reviewed the triage vital signs and the nursing notes.  Pertinent labs & imaging results that were available during my care of the patient were reviewed by me and considered in my medical decision making (see chart for details).     The rash does not appear to be a Lyme's rash. no need to treat with antibiotics at this time.  Rash most likely some kind of allergic/immune response.  Less  likely viral due to otherwise asymptomatic and the rash is more localized to where the tick bite was.  We will try a short burst of steroids and daily Allegra to see if this helps  decrease the symptoms and rash.  Patient instructed that if the rash gets worse with more spreading, pain, blisters or ulcers to please follow-up.  Or if he develops any fever, chills, body aches, fatigue or headaches he needs to be reevaluated. Pt agreeable to plan.  Final Clinical Impressions(s) / UC Diagnoses   Final diagnoses:  Rash     Discharge Instructions     It was nice meeting you!!  We will treat you with a short course of steroids to see if this helps.  Like we discussed this does not appear to be Lyme disease or need antibiotics at this time. Please monitor the rash for worsening symptoms. More painful, blisters, ulcers and spreading. Of this happens please go to the ER.  If you develop fatigue,fever, body aches or headaches return for recheck.      ED Prescriptions    Medication Sig Dispense Auth. Provider   predniSONE (STERAPRED UNI-PAK 21 TAB) 10 MG (21) TBPK tablet 6 tabs for 1 day, then 5 tabs for 1 das, then 4 tabs for 1 day, then 3 tabs for 1 day, 2 tabs for 1 day, then 1 tab for 1 day 21 tablet Rozanna Box, Renso Swett A, NP     Controlled Substance Prescriptions [place] Controlled Substance Registry consulted? Not Applicable   Orvan July, NP 10/05/17 [phone]

## 2017-10-14 ENCOUNTER — Ambulatory Visit (HOSPITAL_COMMUNITY)
Admission: EM | Admit: 2017-10-14 | Discharge: 2017-10-14 | Disposition: A | Discharge status: Home / Self Care | Payer: 59 | Attending: Internal Medicine | Admitting: Internal Medicine

## 2017-10-14 ENCOUNTER — Encounter (HOSPITAL_COMMUNITY): Payer: Self-pay

## 2017-10-14 ENCOUNTER — Other Ambulatory Visit: Payer: Self-pay

## 2017-10-14 DIAGNOSIS — R21 Rash and other nonspecific skin eruption: Secondary | ICD-10-CM | POA: Insufficient documentation

## 2017-10-14 DIAGNOSIS — Z8261 Family history of arthritis: Secondary | ICD-10-CM | POA: Insufficient documentation

## 2017-10-14 DIAGNOSIS — Z79899 Other long term (current) drug therapy: Secondary | ICD-10-CM | POA: Diagnosis not present

## 2017-10-14 DIAGNOSIS — Z820 Family history of epilepsy and other diseases of the nervous system: Secondary | ICD-10-CM | POA: Diagnosis not present

## 2017-10-14 DIAGNOSIS — N529 Male erectile dysfunction, unspecified: Secondary | ICD-10-CM | POA: Diagnosis not present

## 2017-10-14 DIAGNOSIS — E559 Vitamin D deficiency, unspecified: Secondary | ICD-10-CM | POA: Insufficient documentation

## 2017-10-14 DIAGNOSIS — Z8042 Family history of malignant neoplasm of prostate: Secondary | ICD-10-CM | POA: Insufficient documentation

## 2017-10-14 DIAGNOSIS — Z86010 Personal history of colonic polyps: Secondary | ICD-10-CM | POA: Diagnosis not present

## 2017-10-14 DIAGNOSIS — I10 Essential (primary) hypertension: Secondary | ICD-10-CM | POA: Insufficient documentation

## 2017-10-14 LAB — URINALYSIS, ROUTINE W REFLEX MICROSCOPIC
Bilirubin Urine: NEGATIVE
Glucose, UA: NEGATIVE mg/dL
Hgb urine dipstick: NEGATIVE
Ketones, ur: 5 mg/dL — AB
Leukocytes, UA: NEGATIVE
NITRITE: NEGATIVE
PH: 6 (ref 5.0–8.0)
Protein, ur: NEGATIVE mg/dL
SPECIFIC GRAVITY, URINE: 1.013 (ref 1.005–1.030)

## 2017-10-14 LAB — CBC WITH DIFFERENTIAL/PLATELET
ABS IMMATURE GRANULOCYTES: 0.1 10*3/uL (ref 0.0–0.1)
BASOS ABS: 0 10*3/uL (ref 0.0–0.1)
BASOS PCT: 0 %
EOS ABS: 0.3 10*3/uL (ref 0.0–0.7)
EOS PCT: 2 %
HCT: 45.1 % (ref 39.0–52.0)
Hemoglobin: 14.8 g/dL (ref 13.0–17.0)
IMMATURE GRANULOCYTES: 1 %
LYMPHS PCT: 8 %
Lymphs Abs: 0.9 10*3/uL (ref 0.7–4.0)
MCH: 31.4 pg (ref 26.0–34.0)
MCHC: 32.8 g/dL (ref 30.0–36.0)
MCV: 95.8 fL (ref 78.0–100.0)
MONO ABS: 1.3 10*3/uL — AB (ref 0.1–1.0)
MONOS PCT: 11 %
Neutro Abs: 9 10*3/uL — ABNORMAL HIGH (ref 1.7–7.7)
Neutrophils Relative %: 78 %
PLATELETS: 205 10*3/uL (ref 150–400)
RBC: 4.71 MIL/uL (ref 4.22–5.81)
RDW: 13.3 % (ref 11.5–15.5)
WBC: 11.6 10*3/uL — AB (ref 4.0–10.5)

## 2017-10-14 LAB — COMPREHENSIVE METABOLIC PANEL
ALT: 21 U/L (ref 0–44)
AST: 19 U/L (ref 15–41)
Albumin: 3.6 g/dL (ref 3.5–5.0)
Alkaline Phosphatase: 42 U/L (ref 38–126)
Anion gap: 9 (ref 5–15)
BUN: 25 mg/dL — AB (ref 8–23)
CHLORIDE: 101 mmol/L (ref 98–111)
CO2: 25 mmol/L (ref 22–32)
CREATININE: 1.32 mg/dL — AB (ref 0.61–1.24)
Calcium: 8.7 mg/dL — ABNORMAL LOW (ref 8.9–10.3)
GFR, EST NON AFRICAN AMERICAN: 53 mL/min — AB (ref >60)
Glucose, Bld: 105 mg/dL — ABNORMAL HIGH (ref 70–99)
POTASSIUM: 4.4 mmol/L (ref 3.5–5.1)
SODIUM: 135 mmol/L (ref 135–145)
Total Bilirubin: 1.2 mg/dL (ref 0.3–1.2)
Total Protein: 6.1 g/dL — ABNORMAL LOW (ref 6.5–8.1)

## 2017-10-14 NOTE — Discharge Instructions (Addendum)
As discussed, unsure of what is causing rash, although would like to monitor for erythema multiforme. I have attached some information about it. CBC, CMP, urinalysis today for further evaluation. Please continue to monitor for any new exposures that could be causing symptoms. Follow up with PCP on Monday/Tuesday for further evaluation and management needed. If experiencing worsening symptoms, headache, fever, nausea/vomiting, oral lesions, nausea/vomiting, go to the emergency department for further evaluation needed.

## 2017-10-14 NOTE — ED Triage Notes (Signed)
Rashes that started in June. Still having rashes.

## 2017-10-14 NOTE — ED Provider Notes (Signed)
[facility]    CSN: [id number] Arrival date & time: 10/14/17  1455     History   Chief Complaint Chief Complaint  Patient presents with  . Rash    HPI Steven Hartman is a 72 y.o. male.   72 year old male comes in with 2-day history of rash.  States about 2 months ago, had multiple tick bites, causing rash to the lower extremity.  At that time, so primary care and was treated empirically with doxycycline with resolution of rash.  At that time Lyme's was negative.  States had another tick bite, which caused a rash to the lower extremity, and was seen here 2 weeks ago.  He was treated with prednisone with resolution of symptoms.  Current rash started 2 days ago, started after a hot shower.  States some generalized erythema to bilateral chest, but now with erythema to the low abdomen, bilateral legs, bilateral arms.  He denies any itching, pain, swelling.  Denies increased warmth.  Denies fever, chills, night sweats.  Has not tried anything for the symptoms.  He has a dermatologist appointment in a week.     Past Medical History:  Diagnosis Date  . GERD (gastroesophageal reflux disease)   . History of chicken pox   . Sciatic pain   . Synovial cyst of lumbar spine     Patient Active Problem List   Diagnosis Date Noted  . Vitamin D deficiency 08/25/2017  . ECM (erythema chronicum migrans) 08/24/2017  . Tick bite of abdominal wall 08/24/2017  . Need for hepatitis C screening test 08/24/2017  . Erectile dysfunction due to arterial insufficiency 08/24/2017  . Essential hypertension 07/11/2014  . Routine general medical examination at a health care facility 04/24/2013  . Personal history of colonic polyps 09/30/2009  . BACK PAIN WITH RADICULOPATHY 05/19/2007    Past Surgical History:  Procedure Laterality Date  . COLONOSCOPY  multiple  . TONSILLECTOMY AND ADENOIDECTOMY  1973       Home Medications    Prior to Admission medications   Medication Sig Start  Date End Date Taking? Authorizing Provider  Cholecalciferol 2000 units TABS Take 1 tablet (2,000 Units total) by mouth daily. 08/25/17   Janith Lima, MD  fexofenadine (ALLEGRA) 180 MG tablet Take 180 mg by mouth daily.    [provider]  meloxicam (MOBIC) 15 MG tablet TAKE 1 TABLET BY MOUTH DAILY 12/16/16   Hoyt Koch, MD  predniSONE (STERAPRED UNI-PAK 21 TAB) 10 MG (21) TBPK tablet 6 tabs for 1 day, then 5 tabs for 1 das, then 4 tabs for 1 day, then 3 tabs for 1 day, 2 tabs for 1 day, then 1 tab for 1 day 10/05/17   Loura Halt A, NP  sildenafil (REVATIO) 20 MG tablet Take 4 tablets (80 mg total) by mouth daily as needed. 08/24/17   Janith Lima, MD  telmisartan (MICARDIS) 80 MG tablet Take 1 tablet (80 mg total) by mouth daily. 08/24/17   Janith Lima, MD  triamcinolone (NASACORT ALLERGY 24HR) 55 MCG/ACT AERO nasal inhaler Place 2 sprays into the nose as needed.    [provider]    Family History Family History  Problem Relation Age of Onset  . Arthritis Father   . Parkinson's disease Mother 71  . Prostate cancer Brother   . Colon cancer Neg Hx     Social History Social History   Tobacco Use  . Smoking status: Never Smoker  . Smokeless tobacco:  Never Used  Substance Use Topics  . Alcohol use: Yes    Alcohol/week: 3.0 oz    Types: 5 Standard drinks or equivalent per week    Comment: Wine  . Drug use: No     Allergies   Patient has no known allergies.   Review of Systems Review of Systems  Reason unable to perform ROS: See HPI as above.     Physical Exam Triage Vital Signs ED Triage Vitals  Enc Vitals Group     BP 10/14/17 1540 (!) 113/96     Pulse Rate 10/14/17 1540 88     Resp 10/14/17 1540 16     Temp 10/14/17 1539 99.4 F (37.4 C)     Temp Source 10/14/17 1539 Oral     SpO2 10/14/17 1540 98 %     Weight 10/14/17 1541 195 lb (88.5 kg)     Height --      Head Circumference --      Peak Flow --      Pain Score --       Pain Loc --      Pain Edu? --      Excl. in [place]? --    No data found.  Updated Vital Signs BP (!) 113/96   Pulse 88   Temp 99.4 F (37.4 C) (Oral)   Resp 16   Wt 195 lb (88.5 kg)   SpO2 98%   BMI 27.20 kg/m   Physical Exam  Constitutional: He is oriented to person, place, and time. He appears well-developed and well-nourished. No distress.  HENT:  Head: Normocephalic and atraumatic.  Eyes: Pupils are equal, round, and reactive to light. Conjunctivae are normal.  Neurological: He is alert and oriented to person, place, and time.  Skin: He is not diaphoretic.  See picture below.  Erythema without increased warmth.  rashes not raised, and blanchable.  No tenderness to palpation.  No fluctuance, induration.                  UC Treatments / Results  Labs (all labs ordered are listed, but only abnormal results are displayed) Labs Reviewed  CBC WITH DIFFERENTIAL/PLATELET - Abnormal; Notable for the following components:      Result Value   WBC 11.6 (*)    Neutro Abs 9.0 (*)    Monocytes Absolute 1.3 (*)    All other components within normal limits  COMPREHENSIVE METABOLIC PANEL - Abnormal; Notable for the following components:   Glucose, Bld 105 (*)    BUN 25 (*)    Creatinine, Ser 1.32 (*)    Calcium 8.7 (*)    Total Protein 6.1 (*)    GFR calc non Af Amer 53 (*)    All other components within normal limits  URINALYSIS, ROUTINE W REFLEX MICROSCOPIC - Abnormal; Notable for the following components:   Ketones, ur 5 (*)    All other components within normal limits    EKG None  Radiology No results found.  Procedures Procedures (including critical care time)  Medications Ordered in UC Medications - No data to display  Initial Impression / Assessment and Plan / UC Course  I have reviewed the triage vital signs and the nursing notes.  Pertinent labs & imaging results that were available during my care of the patient were reviewed by me and considered  in my medical decision making (see chart for details).    Discussed case with Dr. Valere Dross.  Unknown etiology of rash  that started 2 days ago.  Will baseline CBCs, CMP, and  UA. Would like to monitor for possible erythema multiforme.  Continue antihistamine for itching.  Patient to follow-up with PCP in 3 to 4 days for reevaluation and management.  Patient to follow-up with dermatology as scheduled for further evaluation.  Return precautions given.  Patient expresses understanding and agrees to plan.  Case discussed with Dr. Valere Dross, who agrees to plan.  Patient discharged in stable condition, pending lab work.  WBC slightly elevated. Creatinine at baseline. Will have patient follow up with PCP for continued monitoring.   Final Clinical Impressions(s) / UC Diagnoses   Final diagnoses:  Rash   ED Prescriptions    None        Ok Edwards, PA-C 10/15/17 1020

## 2017-10-19 ENCOUNTER — Ambulatory Visit: Payer: 59 | Admitting: Family

## 2017-10-28 ENCOUNTER — Other Ambulatory Visit: Payer: Self-pay | Admitting: Internal Medicine

## 2017-10-28 DIAGNOSIS — I10 Essential (primary) hypertension: Secondary | ICD-10-CM

## 2017-11-04 ENCOUNTER — Other Ambulatory Visit: Payer: Self-pay

## 2017-11-04 ENCOUNTER — Encounter (HOSPITAL_COMMUNITY): Payer: Self-pay

## 2017-11-04 ENCOUNTER — Emergency Department (HOSPITAL_BASED_OUTPATIENT_CLINIC_OR_DEPARTMENT_OTHER): Payer: 59

## 2017-11-04 ENCOUNTER — Encounter: Payer: Self-pay | Admitting: Internal Medicine

## 2017-11-04 ENCOUNTER — Emergency Department (HOSPITAL_COMMUNITY)
Admission: EM | Admit: 2017-11-04 | Discharge: 2017-11-04 | Disposition: A | Discharge status: Home / Self Care | Payer: 59 | Attending: Emergency Medicine | Admitting: Emergency Medicine

## 2017-11-04 DIAGNOSIS — R609 Edema, unspecified: Secondary | ICD-10-CM

## 2017-11-04 DIAGNOSIS — I82412 Acute embolism and thrombosis of left femoral vein: Secondary | ICD-10-CM | POA: Insufficient documentation

## 2017-11-04 DIAGNOSIS — I10 Essential (primary) hypertension: Secondary | ICD-10-CM | POA: Diagnosis not present

## 2017-11-04 DIAGNOSIS — Z79899 Other long term (current) drug therapy: Secondary | ICD-10-CM | POA: Diagnosis not present

## 2017-11-04 DIAGNOSIS — R2242 Localized swelling, mass and lump, left lower limb: Secondary | ICD-10-CM | POA: Diagnosis present

## 2017-11-04 MED ORDER — APIXABAN (ELIQUIS) EDUCATION KIT FOR DVT/PE PATIENTS: PACK | 0 refills | Status: DC

## 2017-11-04 MED ORDER — APIXABAN 5 MG PO TABS
10.0000 mg | ORAL_TABLET | Freq: Two times a day (BID) | ORAL | Status: DC
  Administered 2017-11-04: 10 mg via ORAL
  Filled 2017-11-04: qty 2

## 2017-11-04 MED ORDER — ELIQUIS 5 MG VTE STARTER PACK: ORAL_TABLET | ORAL | 0 refills | Status: DC

## 2017-11-04 NOTE — ED Notes (Signed)
Pt given d/c instructions as well as spoke with pharmacy regarding medication and need to follow up.

## 2017-11-04 NOTE — ED Triage Notes (Signed)
Patient complains of left lower leg pain and swelling x 2 days, has hx of sciatica and just finished prednisone for same but states that this discomfort is different, denies injury

## 2017-11-04 NOTE — Progress Notes (Signed)
Left lower extremity venous duplex has been completed. There is evidence of acute deep vein thrombosis involving the femoral, popliteal, posterior tibial, and peroneal veins of the left lower extremity. Results were given to Domenic Moras PA.  11/04/17 11:49 AM Steven Hartman RVT

## 2017-11-04 NOTE — ED Provider Notes (Signed)
[facility] EMERGENCY DEPARTMENT Provider Note   CSN: [id number] Arrival date & time: 11/04/17  1001     History   Chief Complaint No chief complaint on file.   HPI Steven Hartman is a 72 y.o. male.  The history is provided by the patient and medical records. No language interpreter was used.     72 year old male presenting for evaluation of leg swelling.  Patient states he has history of spinal stenosis with recurrent sciatica.  This is been ongoing issue for the past 12 years.  He endorses occasional sciatic type of pain down his left leg.  However for the past 2 to 3 days he has noticed increased swelling in his leg which is new.  He endorsed some mild tightness to his leg without worsening back pain bowel bladder changes or saddle anesthesia.  No fever or chills no productive cough, shortness of breath or chest pain.  No recent injury.  No prior history of PE or DVT, no recent surgery, prolonged bedrest, active cancer or hemoptysis.  No specific treatment tried aside from regular physical therapy.  He finished steroid for sciatic pain recently.  Past Medical History:  Diagnosis Date  . GERD (gastroesophageal reflux disease)   . History of chicken pox   . Sciatic pain   . Synovial cyst of lumbar spine     Patient Active Problem List   Diagnosis Date Noted  . Vitamin D deficiency 08/25/2017  . ECM (erythema chronicum migrans) 08/24/2017  . Tick bite of abdominal wall 08/24/2017  . Need for hepatitis C screening test 08/24/2017  . Erectile dysfunction due to arterial insufficiency 08/24/2017  . Essential hypertension 07/11/2014  . Routine general medical examination at a health care facility 04/24/2013  . Personal history of colonic polyps 09/30/2009  . BACK PAIN WITH RADICULOPATHY 05/19/2007    Past Surgical History:  Procedure Laterality Date  . COLONOSCOPY  multiple  . TONSILLECTOMY AND ADENOIDECTOMY  1973        Home Medications     Prior to Admission medications   Medication Sig Start Date End Date Taking? Authorizing Provider  Cholecalciferol 2000 units TABS Take 1 tablet (2,000 Units total) by mouth daily. 08/25/17   Janith Lima, MD  fexofenadine (ALLEGRA) 180 MG tablet Take 180 mg by mouth daily.    [provider]  meloxicam (MOBIC) 15 MG tablet TAKE 1 TABLET BY MOUTH DAILY 12/16/16   Hoyt Koch, MD  MICARDIS 80 MG tablet TAKE 1 TABLET BY MOUTH DAILY 10/28/17   Janith Lima, MD  predniSONE (STERAPRED UNI-PAK 21 TAB) 10 MG (21) TBPK tablet 6 tabs for 1 day, then 5 tabs for 1 das, then 4 tabs for 1 day, then 3 tabs for 1 day, 2 tabs for 1 day, then 1 tab for 1 day 10/05/17   Loura Halt A, NP  sildenafil (REVATIO) 20 MG tablet Take 4 tablets (80 mg total) by mouth daily as needed. 08/24/17   Janith Lima, MD  triamcinolone (NASACORT ALLERGY 24HR) 55 MCG/ACT AERO nasal inhaler Place 2 sprays into the nose as needed.    [provider]    Family History Family History  Problem Relation Age of Onset  . Arthritis Father   . Parkinson's disease Mother 49  . Prostate cancer Brother   . Colon cancer Neg Hx     Social History Social History   Tobacco Use  . Smoking status: Never Smoker  . Smokeless tobacco:  Never Used  Substance Use Topics  . Alcohol use: Yes    Alcohol/week: 5.0 standard drinks    Types: 5 Standard drinks or equivalent per week    Comment: Wine  . Drug use: No     Allergies   Patient has no known allergies.   Review of Systems Review of Systems  All other systems reviewed and are negative.    Physical Exam Updated Vital Signs There were no vitals taken for this visit.  Physical Exam  Constitutional: He appears well-developed and well-nourished. No distress.  HENT:  Head: Atraumatic.  Eyes: Conjunctivae are normal.  Neck: Neck supple.  Cardiovascular: Normal rate, regular rhythm and intact distal pulses.  Pulmonary/Chest: Effort normal and  breath sounds normal.  Abdominal: Soft. Bowel sounds are normal. He exhibits no distension. There is no tenderness.  Musculoskeletal: He exhibits edema (1+ pitting edema noted to left lower extremity extending up towards the knee with mild tenderness to palpation throughout.  Dorsalis pedis pulse palpable).  Right lower extremity is normal.  Neurological: He is alert.  Skin: No rash noted.  Psychiatric: He has a normal mood and affect.  Nursing note and vitals reviewed.    ED Treatments / Results  Labs (all labs ordered are listed, but only abnormal results are displayed) Labs Reviewed - No data to display  EKG None  Radiology No results found.   Left lower extremity venous duplex has been completed. There is evidence of acute deep vein thrombosis involving the femoral, popliteal, posterior tibial, and peroneal veins of the left lower extremity. Results were given to Domenic Moras PA.  11/04/17 11:49 AM Carlos Levering RVT   Procedures Procedures (including critical care time)  Medications Ordered in ED Medications - No data to display   Initial Impression / Assessment and Plan / ED Course  I have reviewed the triage vital signs and the nursing notes.  Pertinent labs & imaging results that were available during my care of the patient were reviewed by me and considered in my medical decision making (see chart for details).     BP (!) 144/89   Pulse 70   Temp 99 F (37.2 C)   Resp 14   SpO2 97%    Final Clinical Impressions(s) / ED Diagnoses   Final diagnoses:  Acute deep vein thrombosis (DVT) of femoral vein of left lower extremity [facility])    ED Discharge Orders         Ordered    apixaban (ELIQUIS) KIT     11/04/17 1330    apixaban (ELIQUIS STARTER PACK) 5 MG TABS     11/04/17 1330         10:21 AM Patient here with unilateral leg swelling involving the left lower extremity concerning for DVT.  Will obtain Doppler ultrasound for further evaluation.  No  worsening back pain or leg pain.  No chest pain or trouble breathing.  Care discussed with DR. Bero.    1:31 PM Doppler study shows evidence of acute deep vein thrombosis involving the femoral, popliteal, posterior tibial and peroneal vein of the left lower extremity.  I discussed this finding with patient.  Patient will be started on apixaban: 10 mg twice daily for seven days, followed by 5 mg twice daily. If therapy continues beyond six months, the dose is reduced to 2.5 mg twice daily  He will need to follow-up closely with his PCP for further care.  Encourage patient to avoid all NSAIDs while taking Eliquis.  Pharmacy was consulted to provide education to patient as well as giving patient starter pack of Eliquis.  Return precautions discussed.   Domenic Moras, PA-C 11/04/17 1333    Maudie Flakes, MD 11/04/17 [phone]

## 2017-11-04 NOTE — Discharge Instructions (Signed)
Information on my medicine - ELIQUIS (apixaban)  This medication education was reviewed with me or my healthcare representative as part of my discharge preparation.  Why was Eliquis prescribed for you? Eliquis was prescribed to treat blood clots that may have been found in the veins of your legs (deep vein thrombosis) or in your lungs (pulmonary embolism) and to reduce the risk of them occurring again.  What do You need to know about Eliquis ? The starting dose is 10 mg (two 5 mg tablets) taken TWICE daily for the FIRST SEVEN (7) DAYS, then on (enter date)  11/11/2017  the dose is reduced to ONE 5 mg tablet taken TWICE daily.  Eliquis may be taken with or without food.   Try to take the dose about the same time in the morning and in the evening. If you have difficulty swallowing the tablet whole please discuss with your pharmacist how to take the medication safely.  Take Eliquis exactly as prescribed and DO NOT stop taking Eliquis without talking to the doctor who prescribed the medication.  Stopping may increase your risk of developing a new blood clot.  Refill your prescription before you run out.  After discharge, you should have regular check-up appointments with your healthcare provider that is prescribing your Eliquis.    What do you do if you miss a dose? If a dose of ELIQUIS is not taken at the scheduled time, take it as soon as possible on the same day and twice-daily administration should be resumed. The dose should not be doubled to make up for a missed dose.  Important Safety Information A possible side effect of Eliquis is bleeding. You should call your healthcare provider right away if you experience any of the following: ? Bleeding from an injury or your nose that does not stop. ? Unusual colored urine (red or dark brown) or unusual colored stools (red or black). ? Unusual bruising for unknown reasons. ? A serious fall or if you hit your head (even if there is no  bleeding).  Some medicines may interact with Eliquis and might increase your risk of bleeding or clotting while on Eliquis. To help avoid this, consult your healthcare provider or pharmacist prior to using any new prescription or non-prescription medications, including herbals, vitamins, non-steroidal anti-inflammatory drugs (NSAIDs) and supplements.  This website has more information on Eliquis (apixaban): http://www.eliquis.com/eliquis/home

## 2017-11-10 ENCOUNTER — Ambulatory Visit (INDEPENDENT_AMBULATORY_CARE_PROVIDER_SITE_OTHER): Payer: 59 | Admitting: Internal Medicine

## 2017-11-10 ENCOUNTER — Encounter: Payer: Self-pay | Admitting: Internal Medicine

## 2017-11-10 ENCOUNTER — Other Ambulatory Visit: Payer: 59

## 2017-11-10 VITALS — BP 112/82 | HR 55 | Temp 97.5°F | Ht 71.0 in | Wt 197.0 lb

## 2017-11-10 DIAGNOSIS — Z91018 Allergy to other foods: Secondary | ICD-10-CM

## 2017-11-10 DIAGNOSIS — I82402 Acute embolism and thrombosis of unspecified deep veins of left lower extremity: Secondary | ICD-10-CM

## 2017-11-10 NOTE — Progress Notes (Signed)
   Subjective:    Patient ID: Steven Hartman, male    DOB: 1945/08/15, 72 y.o.   MRN: [id number]  HPI The patient is a 72 YO man coming in for DVT in his leg. Happened about 1-2 weeks ago. Went to ER and diagnosed with [place]. Was having more pain in the left leg and some swelling. Is taking eliquis since that time. Swelling is going down some. The pain is stable. He does have chronic sciatica pain from his back and is needing some back surgery in the next several months. Denies travel prior to event. Has been slightly less active with the worsening back pain. Denies fevers or chills. Denies SOB or chest pains. Denies family history of blood clots.  He has allergy to alpha gal new that is causing him rash with eating beef. He has had several tick bites this summer which is when the rash started. He went through several courses of prednisone and creams for the rash which did help temporarily.   PMH, [facility], social history reviewed and updated.   Review of Systems  Constitutional: Negative.   HENT: Negative.   Eyes: Negative.   Respiratory: Negative for cough, chest tightness and shortness of breath.   Cardiovascular: Positive for leg swelling. Negative for chest pain and palpitations.  Gastrointestinal: Negative for abdominal distention, abdominal pain, constipation, diarrhea, nausea and vomiting.  Musculoskeletal: Negative.   Skin: Negative.   Neurological: Negative.   Psychiatric/Behavioral: Negative.       Objective:   Physical Exam  Constitutional: He is oriented to person, place, and time. He appears well-developed and well-nourished.  HENT:  Head: Normocephalic and atraumatic.  Eyes: EOM are normal.  Neck: Normal range of motion.  Cardiovascular: Normal rate and regular rhythm.  Pulmonary/Chest: Effort normal and breath sounds normal. No respiratory distress. He has no wheezes. He has no rales.  Abdominal: Soft. Bowel sounds are normal. He exhibits no distension. There is no  tenderness. There is no rebound.  Musculoskeletal: He exhibits edema.  1+ edema left leg without pitting.   Neurological: He is alert and oriented to person, place, and time. Coordination normal.  Skin: Skin is warm and dry.  Psychiatric: He has a normal mood and affect.   Vitals:   11/10/17 1025  BP: 112/82  Pulse: (!) 55  Temp: (!) 97.5 F (36.4 C)  TempSrc: Oral  SpO2: 97%  Weight: 197 lb (89.4 kg)  Height: 5\' 11"  (1.803 m)      Assessment & Plan:

## 2017-11-10 NOTE — Patient Instructions (Addendum)
We will plan for 6 months of therapy with the eliquis.   It will be okay to get spinal surgery end of October.   We are checking the labs today to check for any clotting disorder.

## 2017-11-10 NOTE — Assessment & Plan Note (Addendum)
Post tick bite and cannot eat beef any longer. We talked about risk of anaphylaxis due to eating beef and he will avoid.

## 2017-11-11 ENCOUNTER — Encounter: Payer: Self-pay | Admitting: Internal Medicine

## 2017-11-11 DIAGNOSIS — I82402 Acute embolism and thrombosis of unspecified deep veins of left lower extremity: Secondary | ICD-10-CM | POA: Insufficient documentation

## 2017-11-11 LAB — SPECIMEN STATUS REPORT

## 2017-11-11 MED ORDER — APIXABAN 5 MG PO TABS: 5.0000 mg | ORAL_TABLET | Freq: Two times a day (BID) | ORAL | 1 refills | Status: DC

## 2017-11-11 NOTE — Assessment & Plan Note (Signed)
Checking hypercoagulable panel as none was done in ER and I would consider this unprovoked. Would recommend 6 month therapy for this as he had clots in multiple veins. Will be okay with back surgery about 3 months after starting therapy and resuming after surgery when bleeding risk acceptable. If any hypercoag abnormal may need consultation with hematology to discuss risk and benefit to ongoing anticoagulation therapy. Refilled eliquis today for 6 month therapy.

## 2017-11-19 LAB — HYPERCOAGULABLE PANEL, COMPREHENSIVE
APTT: 28.4 s
AT III ACT/NOR PPP CHRO: 187 % — AB
Act. Prt C Resist w/FV Defic.: 2 ratio — ABNORMAL LOW
DRVVT Confirm Seconds: 77.7 s
DRVVT Ratio: 1.6 ratio — ABNORMAL HIGH
DRVVT Screen Seconds: 134.1 s — ABNORMAL HIGH
FACTOR VII ANTIGEN: 118 %
Factor VIII Activity: 212 % — ABNORMAL HIGH
Hexagonal Phospholipid Neutral: 0 s
PROT C AG ACT/NOR PPP IMM: 108 %
PROT S AG ACT/NOR PPP IMM: 118 %
PROTEIN C AG/FVII AG RATIO: 0.9 ratio
Protein S Ag/FVII Ag Ratio**: 1 ratio

## 2017-11-19 LAB — FACTOR V LEIDEN

## 2017-12-08 ENCOUNTER — Other Ambulatory Visit: Payer: Self-pay | Admitting: Internal Medicine

## 2017-12-08 DIAGNOSIS — N183 Chronic kidney disease, stage 3 unspecified: Secondary | ICD-10-CM

## 2017-12-14 ENCOUNTER — Ambulatory Visit
Admission: RE | Admit: 2017-12-14 | Discharge: 2017-12-14 | Disposition: A | Discharge status: Home / Self Care | Payer: 59 | Source: Ambulatory Visit | Attending: Internal Medicine | Admitting: Internal Medicine

## 2017-12-14 DIAGNOSIS — N183 Chronic kidney disease, stage 3 unspecified: Secondary | ICD-10-CM

## 2018-01-04 ENCOUNTER — Telehealth (HOSPITAL_COMMUNITY): Payer: Self-pay | Admitting: Surgery

## 2018-01-04 NOTE — Telephone Encounter (Signed)
Called patient to schedule a follow up LE Venous duplex scan left leg for acute deep venous thrombosis of thigh- Dr. Joylene Draft is requesting this duplex one from now (around 02/01/2018)- I left a vm at [phone] asking pt to call Rip Harbour at [facility] to arrange the appt.

## 2018-01-17 ENCOUNTER — Other Ambulatory Visit: Payer: Self-pay

## 2018-01-17 DIAGNOSIS — I82402 Acute embolism and thrombosis of unspecified deep veins of left lower extremity: Secondary | ICD-10-CM

## 2018-01-31 ENCOUNTER — Ambulatory Visit (HOSPITAL_COMMUNITY)
Admission: RE | Admit: 2018-01-31 | Discharge: 2018-01-31 | Disposition: A | Discharge status: Home / Self Care | Payer: Medicare HMO | Source: Ambulatory Visit | Attending: Vascular Surgery | Admitting: Vascular Surgery

## 2018-01-31 DIAGNOSIS — I82402 Acute embolism and thrombosis of unspecified deep veins of left lower extremity: Secondary | ICD-10-CM | POA: Diagnosis not present

## 2018-02-01 ENCOUNTER — Encounter (HOSPITAL_COMMUNITY): Payer: 59

## 2018-02-13 ENCOUNTER — Other Ambulatory Visit: Payer: Self-pay | Admitting: Neurosurgery

## 2018-02-27 ENCOUNTER — Other Ambulatory Visit: Payer: Self-pay | Admitting: Internal Medicine

## 2018-02-27 DIAGNOSIS — I10 Essential (primary) hypertension: Secondary | ICD-10-CM

## 2018-02-28 ENCOUNTER — Other Ambulatory Visit: Payer: Self-pay | Admitting: Internal Medicine

## 2018-02-28 DIAGNOSIS — I10 Essential (primary) hypertension: Secondary | ICD-10-CM

## 2018-03-06 ENCOUNTER — Other Ambulatory Visit: Payer: Self-pay | Admitting: Internal Medicine

## 2018-03-06 DIAGNOSIS — I10 Essential (primary) hypertension: Secondary | ICD-10-CM

## 2018-03-10 NOTE — Pre-Procedure Instructions (Signed)
Ebin Palazzi Willow  03/10/2018      [facility] - [place] - [address] [address] [place] [address] Phone: [phone] Fax: [phone]    Your procedure is scheduled on Wed., Jan. 8, 2020 from 9:05AM-12:58PM  Report to [facility] Admitting Entrance "A" at 7:05AM  Call this number if you have problems the morning of surgery:  [phone]   Remember:  Do not eat or drink after midnight on Jan. 6th   Take ONLY these medicines the morning of surgery with A SIP OF WATER: If needed: Fexofenadine (ALLEGRA) and Triamcinolone (NASACORT ALLERGY 24HR)  Follow your surgeon's instructions on when to stop Eliquis.  If no instructions were given by your surgeon then you will need to call the office to get those instructions.   7 days before surgery (03/15/18), stop taking all Other Aspirin Products, Vitamins, Fish oils, and Herbal medications. Also stop all NSAIDS i.e. Advil, Ibuprofen, Motrin, Aleve, Anaprox, Naproxen, BC, Goody Powders, and all Supplements.      Do not wear jewelry.  Do not wear lotions, powders, colognes, or deodorant.  Do not shave 48 hours prior to surgery.  Men may shave face.  Do not bring valuables to the hospital.  [facility] is not responsible for any belongings or valuables.  Contacts, dentures or bridgework may not be worn into surgery.  Leave your suitcase in the car.  After surgery it may be brought to your room.  For patients admitted to the hospital, discharge time will be determined by your treatment team.  Patients discharged the day of surgery will not be allowed to drive home.   Special instructions:  [place] [facility]- Preparing For Surgery  Before surgery, you can play an important role. Because skin is not sterile, your skin needs to be as free of germs as possible. You can reduce the number of germs on your skin by washing with CHG (chlorahexidine gluconate) Soap before  surgery.  CHG is an antiseptic cleaner which kills germs and bonds with the skin to continue killing germs even after washing.    Oral Hygiene is also important to reduce your risk of infection.  Remember - BRUSH YOUR TEETH THE MORNING OF SURGERY WITH YOUR REGULAR TOOTHPASTE  Please do not use if you have an allergy to CHG or antibacterial soaps. If your skin becomes reddened/irritated stop using the CHG.  Do not shave (including legs and underarms) for at least 48 hours prior to first CHG shower. It is OK to shave your face.  Please follow these instructions carefully.   1. Shower the NIGHT BEFORE SURGERY and the MORNING OF SURGERY with CHG.   2. If you chose to wash your hair, wash your hair first as usual with your normal shampoo.  3. After you shampoo, rinse your hair and body thoroughly to remove the shampoo.  4. Use CHG as you would any other liquid soap. You can apply CHG directly to the skin and wash gently with a scrungie or a clean washcloth.   5. Apply the CHG Soap to your body ONLY FROM THE NECK DOWN.  Do not use on open wounds or open sores. Avoid contact with your eyes, ears, mouth and genitals (private parts). Wash Face and genitals (private parts)  with your normal soap.  6. Wash thoroughly, paying special attention to the area where your surgery will be performed.  7. Thoroughly rinse your body with warm  water from the neck down.  8. DO NOT shower/wash with your normal soap after using and rinsing off the CHG Soap.  9. Pat yourself dry with a CLEAN TOWEL.  10. Wear CLEAN PAJAMAS to bed the night before surgery, wear comfortable clothes the morning of surgery  11. Place CLEAN SHEETS on your bed the night of your first shower and DO NOT SLEEP WITH PETS.  Day of Surgery:  Do not apply any deodorants/lotions.  Please wear clean clothes to the hospital/surgery center.   Remember to brush your teeth WITH YOUR REGULAR TOOTHPASTE.  Please read over the following fact  sheets that you were given. Pain Booklet, Coughing and Deep Breathing, MRSA Information and Surgical Site Infection Prevention

## 2018-03-13 ENCOUNTER — Other Ambulatory Visit: Payer: Self-pay | Admitting: Neurosurgery

## 2018-03-13 ENCOUNTER — Encounter (HOSPITAL_COMMUNITY)
Admission: RE | Admit: 2018-03-13 | Discharge: 2018-03-13 | Disposition: A | Discharge status: Home / Self Care | Payer: 59 | Source: Ambulatory Visit | Attending: Neurosurgery | Admitting: Neurosurgery

## 2018-03-13 ENCOUNTER — Encounter (HOSPITAL_COMMUNITY): Payer: Self-pay

## 2018-03-13 ENCOUNTER — Other Ambulatory Visit: Payer: Self-pay

## 2018-03-13 DIAGNOSIS — Z01818 Encounter for other preprocedural examination: Secondary | ICD-10-CM | POA: Insufficient documentation

## 2018-03-13 HISTORY — DX: Activated protein C resistance: D68.51

## 2018-03-13 HISTORY — DX: Essential (primary) hypertension: I10

## 2018-03-13 LAB — COMPREHENSIVE METABOLIC PANEL
ALK PHOS: 39 U/L (ref 38–126)
ALT: 24 U/L (ref 0–44)
AST: 20 U/L (ref 15–41)
Albumin: 3.8 g/dL (ref 3.5–5.0)
Anion gap: 10 (ref 5–15)
BUN: 12 mg/dL (ref 8–23)
CO2: 22 mmol/L (ref 22–32)
Calcium: 9.4 mg/dL (ref 8.9–10.3)
Chloride: 107 mmol/L (ref 98–111)
Creatinine, Ser: 1.19 mg/dL (ref 0.61–1.24)
GFR calc Af Amer: >60 mL/min (ref >60)
Glucose, Bld: 99 mg/dL (ref 70–99)
Potassium: 4.4 mmol/L (ref 3.5–5.1)
Sodium: 139 mmol/L (ref 135–145)
Total Bilirubin: 0.6 mg/dL (ref 0.3–1.2)
Total Protein: 6.5 g/dL (ref 6.5–8.1)

## 2018-03-13 LAB — CBC
HCT: 48.2 % (ref 39.0–52.0)
Hemoglobin: 15.9 g/dL (ref 13.0–17.0)
MCH: 30.9 pg (ref 26.0–34.0)
MCHC: 33 g/dL (ref 30.0–36.0)
MCV: 93.6 fL (ref 80.0–100.0)
Platelets: 202 10*3/uL (ref 150–400)
RBC: 5.15 MIL/uL (ref 4.22–5.81)
RDW: 12.8 % (ref 11.5–15.5)
WBC: 6.4 10*3/uL (ref 4.0–10.5)
nRBC: 0 % (ref 0.0–0.2)

## 2018-03-13 LAB — TYPE AND SCREEN
ABO/RH(D): A POS
Antibody Screen: NEGATIVE

## 2018-03-13 LAB — ABO/RH: ABO/RH(D): A POS

## 2018-03-13 LAB — SURGICAL PCR SCREEN
MRSA, PCR: NEGATIVE
Staphylococcus aureus: NEGATIVE

## 2018-03-13 NOTE — Progress Notes (Signed)
PCP - Dr. Crist Infante  Cardiologist - Denies  Chest x-ray - Denies  EKG - 03/13/18  Stress Test - Denies  ECHO - Denies  Cardiac Cath - Denies  AICD- na PM- na LOOP- na  Sleep Study - Denies CPAP - None  LABS- 03/13/18: CBC, CMP, PCR 03/22/18: PT  ASA- Denies Eliquis- LD-03/19/18   Anesthesia- Yes- medical history-pt has Factor 5 Leiden, and taking Eliquis.  Pt denies having chest pain, sob, or fever at this time. All instructions explained to the pt, with a verbal understanding of the material. Pt agrees to go over the instructions while at home for a better understanding. The opportunity to ask questions was provided.

## 2018-03-14 NOTE — Progress Notes (Signed)
Anesthesia Chart Review:  Case:  [id number] Date/Time:  03/22/18 0850   Procedure:  POSTERIOR LUMBAR INTERBODY FUSION, INTERBODY PROSTHESIS, POSTERIOR INSTRUMENTATION AND FUSION LUMBAR 4- LUMBAR 5 (N/A ) - POSTERIOR LUMBAR INTERBODY FUSION, INTERBODY PROSTHESIS, POSTERIOR INSTRUMENTATION AND FUSION LUMBAR 4- LUMBAR 5   Anesthesia type:  General   Pre-op diagnosis:  SPONDYLOLISTHESIS, LUMBAR REGION   Location:  [facility] OR ROOM 21 / [facility]   Surgeon:  Newman Pies, MD      DISCUSSION: Patient is a 72 year old male scheduled for the above procedure.  History includes never smoker, LLE DVT 11/05/16 (treated with Eliquis; 11/10/17 hypercoagulable work-up revealed heterozygous Factor V Leiden mutation and elevated Factor VIII; DRVVT elevated but on Eliquis), GERD, HTN.    His PCP Dr. Joylene Draft wrote on 02/09/18 that patient's follow-up BLE ultrasound showed similar findings (age indeterminate LLE DVT) to August and since patient had been on full dose Eliquis for three months then he felt patient was okay to proceed with back surgery. He added that if patient doing better from a back pain standpoint then it "would not be a bad idea" to hold off another three months, but if still with significant issues then could proceed with recommendation to hold Eliquis two days prior to surgery and resume post-operatively asap.   If no acute changes then I would anticipate that he can proceed as planned.He will need a PT/INR on the day of surgery.    VS: BP 137/83   Pulse 83   Temp 36.5 C   Resp 20   Ht 5\' 11"  (1.803 m)   Wt 91.7 kg   SpO2 95%   BMI 28.19 kg/m   PROVIDERS: Crist Infante, MD is PCP. He was seeing Pricilla Holm, MD at the time of his August DVT. Both have signed a medical clearance for surgery with permission to temporarily hold Eliquis.   LABS: Labs reviewed: Acceptable for surgery. He is for PT/INR on the day of surgery.  (all labs ordered are listed, but only abnormal results are  displayed)  Labs Reviewed  SURGICAL PCR SCREEN  COMPREHENSIVE METABOLIC PANEL  CBC  TYPE AND SCREEN  ABO/RH    EKG: 03/13/18 (had to print copy from [facility]): NSR.    CV: BLE venous Duplex 01/31/18: Summary: Right: No evidence of common femoral vein obstruction. Left: Findings consistent with age indeterminate deep vein thrombosis involving the left femoral vein, left popliteal vein, and left peroneal vein. (Had acute DVT left femoral vein, popliteal vein, posterior tibial veins, and peroneal veins on 11/05/17 Duplex.)  Renal U/S 12/14/17: IMPRESSION: No renal sonographic abnormalities identified.   Past Medical History:  Diagnosis Date  . Factor 5 Leiden mutation, heterozygous ([place])   . GERD (gastroesophageal reflux disease)   . History of chicken pox   . Hypertension   . Sciatic pain   . Synovial cyst of lumbar spine     Past Surgical History:  Procedure Laterality Date  . COLONOSCOPY  multiple  . TONSILLECTOMY AND ADENOIDECTOMY  1973    MEDICATIONS: . apixaban (ELIQUIS) 5 MG TABS tablet  . Cholecalciferol 2000 units TABS  . fexofenadine (ALLEGRA) 180 MG tablet  . MICARDIS 80 MG tablet  . Multiple Vitamins-Minerals (MULTIVITAMIN PO)  . sildenafil (REVATIO) 20 MG tablet  . triamcinolone (NASACORT ALLERGY 24HR) 55 MCG/ACT AERO nasal inhaler   No current facility-administered medications for this encounter.     George Hugh [facility] Short Stay Center/Anesthesiology Phone [phone] 03/14/2018 1:33 PM

## 2018-03-14 NOTE — Anesthesia Preprocedure Evaluation (Addendum)
Anesthesia Evaluation  Patient identified by MRN, date of birth, ID band Patient awake    Reviewed: Allergy & Precautions, NPO status , Patient's Chart, lab work & pertinent test results  Airway Mallampati: I  TM Distance: >3 FB Neck ROM: Full    Dental   Pulmonary    Pulmonary exam normal        Cardiovascular hypertension, Pt. on medications Normal cardiovascular exam     Neuro/Psych    GI/Hepatic GERD  Medicated and Controlled,  Endo/Other    Renal/GU      Musculoskeletal   Abdominal   Peds  Hematology   Anesthesia Other Findings   Reproductive/Obstetrics                            Anesthesia Physical Anesthesia Plan  ASA: III  Anesthesia Plan: General   Post-op Pain Management:    Induction: Intravenous  PONV Risk Score and Plan: 2 and Ondansetron, Dexamethasone and Midazolam  Airway Management Planned: Oral ETT  Additional Equipment:   Intra-op Plan:   Post-operative Plan: Extubation in OR  Informed Consent: I have reviewed the patients History and Physical, chart, labs and discussed the procedure including the risks, benefits and alternatives for the proposed anesthesia with the patient or authorized representative who has indicated his/her understanding and acceptance.     Plan Discussed with: CRNA and Surgeon  Anesthesia Plan Comments: (PAT note written 03/14/2018 by Myra Gianotti, PA-C. )       Anesthesia Quick Evaluation

## 2018-03-15 HISTORY — PX: BACK SURGERY: SHX140

## 2018-03-22 ENCOUNTER — Inpatient Hospital Stay (HOSPITAL_COMMUNITY)
Admission: RE | Admit: 2018-03-22 | Discharge: 2018-03-23 | DRG: 454 | Disposition: A | Discharge status: Home / Self Care | Payer: 59 | Attending: Neurosurgery | Admitting: Neurosurgery

## 2018-03-22 ENCOUNTER — Inpatient Hospital Stay (HOSPITAL_COMMUNITY): Payer: 59

## 2018-03-22 ENCOUNTER — Inpatient Hospital Stay (HOSPITAL_COMMUNITY): Payer: 59 | Admitting: Vascular Surgery

## 2018-03-22 ENCOUNTER — Encounter (HOSPITAL_COMMUNITY): Payer: Self-pay | Admitting: *Deleted

## 2018-03-22 ENCOUNTER — Inpatient Hospital Stay (HOSPITAL_COMMUNITY): Payer: 59 | Admitting: Certified Registered Nurse Anesthetist

## 2018-03-22 ENCOUNTER — Inpatient Hospital Stay (HOSPITAL_COMMUNITY)
Admission: RE | Disposition: A | Discharge status: Home / Self Care | Payer: Self-pay | Source: Home / Self Care | Attending: Neurosurgery

## 2018-03-22 DIAGNOSIS — Z91018 Allergy to other foods: Secondary | ICD-10-CM | POA: Diagnosis not present

## 2018-03-22 DIAGNOSIS — Z86718 Personal history of other venous thrombosis and embolism: Secondary | ICD-10-CM

## 2018-03-22 DIAGNOSIS — I10 Essential (primary) hypertension: Secondary | ICD-10-CM | POA: Diagnosis present

## 2018-03-22 DIAGNOSIS — K219 Gastro-esophageal reflux disease without esophagitis: Secondary | ICD-10-CM | POA: Diagnosis present

## 2018-03-22 DIAGNOSIS — Z419 Encounter for procedure for purposes other than remedying health state, unspecified: Secondary | ICD-10-CM

## 2018-03-22 DIAGNOSIS — Z7901 Long term (current) use of anticoagulants: Secondary | ICD-10-CM | POA: Diagnosis not present

## 2018-03-22 DIAGNOSIS — D6851 Activated protein C resistance: Secondary | ICD-10-CM | POA: Diagnosis present

## 2018-03-22 DIAGNOSIS — M5116 Intervertebral disc disorders with radiculopathy, lumbar region: Principal | ICD-10-CM | POA: Diagnosis present

## 2018-03-22 DIAGNOSIS — M4316 Spondylolisthesis, lumbar region: Secondary | ICD-10-CM | POA: Diagnosis present

## 2018-03-22 DIAGNOSIS — Z79899 Other long term (current) drug therapy: Secondary | ICD-10-CM

## 2018-03-22 DIAGNOSIS — M48062 Spinal stenosis, lumbar region with neurogenic claudication: Secondary | ICD-10-CM | POA: Diagnosis present

## 2018-03-22 LAB — PROTIME-INR
INR: 1
Prothrombin Time: 13.1 seconds (ref 11.4–15.2)

## 2018-03-22 SURGERY — POSTERIOR LUMBAR FUSION 1 LEVEL
Anesthesia: General

## 2018-03-22 MED ORDER — TRIAMCINOLONE ACETONIDE 55 MCG/ACT NA AERO
2.0000 | INHALATION_SPRAY | Freq: Every day | NASAL | Status: DC | PRN
  Filled 2018-03-22: qty 21.6

## 2018-03-22 MED ORDER — VANCOMYCIN HCL 1 G IV SOLR
INTRAVENOUS | Status: DC | PRN
  Administered 2018-03-22: 1000 mg

## 2018-03-22 MED ORDER — MENTHOL 3 MG MT LOZG: 1.0000 | LOZENGE | OROMUCOSAL | Status: DC | PRN

## 2018-03-22 MED ORDER — CYCLOBENZAPRINE HCL 10 MG PO TABS
10.0000 mg | ORAL_TABLET | Freq: Three times a day (TID) | ORAL | Status: DC | PRN
  Administered 2018-03-22 (×2): 10 mg via ORAL
  Filled 2018-03-22 (×2): qty 1

## 2018-03-22 MED ORDER — ROCURONIUM BROMIDE 50 MG/5ML IV SOSY
PREFILLED_SYRINGE | INTRAVENOUS | Status: DC | PRN
  Administered 2018-03-22: 50 mg via INTRAVENOUS
  Administered 2018-03-22: 30 mg via INTRAVENOUS

## 2018-03-22 MED ORDER — SODIUM CHLORIDE 0.9 % IV SOLN
INTRAVENOUS | Status: DC | PRN
  Administered 2018-03-22: 25 ug/min via INTRAVENOUS

## 2018-03-22 MED ORDER — PROPOFOL 10 MG/ML IV BOLUS
INTRAVENOUS | Status: AC
  Filled 2018-03-22: qty 20

## 2018-03-22 MED ORDER — IRBESARTAN 300 MG PO TABS
300.0000 mg | ORAL_TABLET | Freq: Every day | ORAL | Status: DC
  Administered 2018-03-22: 300 mg via ORAL
  Filled 2018-03-22 (×2): qty 1

## 2018-03-22 MED ORDER — EPHEDRINE SULFATE 50 MG/ML IJ SOLN
INTRAMUSCULAR | Status: DC | PRN
  Administered 2018-03-22 (×2): 10 mg via INTRAVENOUS

## 2018-03-22 MED ORDER — FENTANYL CITRATE (PF) 250 MCG/5ML IJ SOLN
INTRAMUSCULAR | Status: AC
  Filled 2018-03-22: qty 5

## 2018-03-22 MED ORDER — ACETAMINOPHEN 325 MG PO TABS: 650.0000 mg | ORAL_TABLET | ORAL | Status: DC | PRN

## 2018-03-22 MED ORDER — OXYCODONE HCL 5 MG PO TABS
10.0000 mg | ORAL_TABLET | ORAL | Status: DC | PRN
  Administered 2018-03-22 – 2018-03-23 (×4): 10 mg via ORAL
  Filled 2018-03-22 (×4): qty 2

## 2018-03-22 MED ORDER — PHENYLEPHRINE HCL 10 MG/ML IJ SOLN
INTRAMUSCULAR | Status: DC | PRN
  Administered 2018-03-22 (×3): 80 ug via INTRAVENOUS
  Administered 2018-03-22: 120 ug via INTRAVENOUS

## 2018-03-22 MED ORDER — DEXAMETHASONE SODIUM PHOSPHATE 10 MG/ML IJ SOLN
INTRAMUSCULAR | Status: AC
  Filled 2018-03-22: qty 1

## 2018-03-22 MED ORDER — GLYCOPYRROLATE 0.2 MG/ML IJ SOLN
INTRAMUSCULAR | Status: DC | PRN
  Administered 2018-03-22 (×2): 0.1 mg via INTRAVENOUS

## 2018-03-22 MED ORDER — THROMBIN (RECOMBINANT) 20000 UNITS EX SOLR
CUTANEOUS | Status: AC
  Filled 2018-03-22: qty 20000

## 2018-03-22 MED ORDER — OXYCODONE HCL 5 MG PO TABS: 5.0000 mg | ORAL_TABLET | ORAL | Status: DC | PRN

## 2018-03-22 MED ORDER — ARTIFICIAL TEARS OPHTHALMIC OINT
TOPICAL_OINTMENT | OPHTHALMIC | Status: AC
  Filled 2018-03-22: qty 3.5

## 2018-03-22 MED ORDER — THROMBIN (RECOMBINANT) 5000 UNITS EX SOLR
CUTANEOUS | Status: AC
  Filled 2018-03-22: qty 5000

## 2018-03-22 MED ORDER — BACITRACIN ZINC 500 UNIT/GM EX OINT
TOPICAL_OINTMENT | CUTANEOUS | Status: AC
  Filled 2018-03-22: qty 28.35

## 2018-03-22 MED ORDER — CEFAZOLIN SODIUM-DEXTROSE 2-4 GM/100ML-% IV SOLN
2.0000 g | Freq: Three times a day (TID) | INTRAVENOUS | Status: AC
  Administered 2018-03-22 (×2): 2 g via INTRAVENOUS
  Filled 2018-03-22 (×2): qty 100

## 2018-03-22 MED ORDER — ONDANSETRON HCL 4 MG/2ML IJ SOLN: 4.0000 mg | Freq: Four times a day (QID) | INTRAMUSCULAR | Status: DC | PRN

## 2018-03-22 MED ORDER — SODIUM CHLORIDE 0.9 % IV SOLN: 250.0000 mL | INTRAVENOUS | Status: DC

## 2018-03-22 MED ORDER — VANCOMYCIN HCL 1000 MG IV SOLR
INTRAVENOUS | Status: AC
  Filled 2018-03-22: qty 1000

## 2018-03-22 MED ORDER — CYCLOBENZAPRINE HCL 10 MG PO TABS
ORAL_TABLET | ORAL | Status: AC
  Filled 2018-03-22: qty 1

## 2018-03-22 MED ORDER — PROPOFOL 10 MG/ML IV BOLUS
INTRAVENOUS | Status: DC | PRN
  Administered 2018-03-22: 110 mg via INTRAVENOUS

## 2018-03-22 MED ORDER — SUGAMMADEX SODIUM 200 MG/2ML IV SOLN
INTRAVENOUS | Status: DC | PRN
  Administered 2018-03-22: 183.4 mg via INTRAVENOUS

## 2018-03-22 MED ORDER — THROMBIN 5000 UNITS EX SOLR
OROMUCOSAL | Status: DC | PRN
  Administered 2018-03-22: 08:00:00 via TOPICAL

## 2018-03-22 MED ORDER — ONDANSETRON HCL 4 MG/2ML IJ SOLN
INTRAMUSCULAR | Status: DC | PRN
  Administered 2018-03-22: 4 mg via INTRAVENOUS

## 2018-03-22 MED ORDER — 0.9 % SODIUM CHLORIDE (POUR BTL) OPTIME
TOPICAL | Status: DC | PRN
  Administered 2018-03-22: 1000 mL

## 2018-03-22 MED ORDER — ALBUMIN HUMAN 5 % IV SOLN
INTRAVENOUS | Status: DC | PRN
  Administered 2018-03-22: 12:00:00 via INTRAVENOUS

## 2018-03-22 MED ORDER — HYDROMORPHONE HCL 1 MG/ML IJ SOLN
0.2500 mg | INTRAMUSCULAR | Status: DC | PRN
  Administered 2018-03-22 (×2): 0.5 mg via INTRAVENOUS

## 2018-03-22 MED ORDER — LIDOCAINE 2% (20 MG/ML) 5 ML SYRINGE
INTRAMUSCULAR | Status: DC | PRN
  Administered 2018-03-22: 100 mg via INTRAVENOUS

## 2018-03-22 MED ORDER — BISACODYL 10 MG RE SUPP: 10.0000 mg | Freq: Every day | RECTAL | Status: DC | PRN

## 2018-03-22 MED ORDER — GLYCOPYRROLATE PF 0.2 MG/ML IJ SOSY
PREFILLED_SYRINGE | INTRAMUSCULAR | Status: AC
  Filled 2018-03-22: qty 1

## 2018-03-22 MED ORDER — ROCURONIUM BROMIDE 50 MG/5ML IV SOSY
PREFILLED_SYRINGE | INTRAVENOUS | Status: AC
  Filled 2018-03-22: qty 10

## 2018-03-22 MED ORDER — ACETAMINOPHEN 650 MG RE SUPP: 650.0000 mg | RECTAL | Status: DC | PRN

## 2018-03-22 MED ORDER — EPHEDRINE 5 MG/ML INJ
INTRAVENOUS | Status: AC
  Filled 2018-03-22: qty 10

## 2018-03-22 MED ORDER — MIDAZOLAM HCL 2 MG/2ML IJ SOLN
INTRAMUSCULAR | Status: DC | PRN
  Administered 2018-03-22: 2 mg via INTRAVENOUS

## 2018-03-22 MED ORDER — BUPIVACAINE-EPINEPHRINE (PF) 0.25% -1:200000 IJ SOLN
INTRAMUSCULAR | Status: AC
  Filled 2018-03-22: qty 30

## 2018-03-22 MED ORDER — ONDANSETRON HCL 4 MG PO TABS: 4.0000 mg | ORAL_TABLET | Freq: Four times a day (QID) | ORAL | Status: DC | PRN

## 2018-03-22 MED ORDER — METOCLOPRAMIDE HCL 5 MG/ML IJ SOLN
INTRAMUSCULAR | Status: AC
  Filled 2018-03-22: qty 2

## 2018-03-22 MED ORDER — PHENYLEPHRINE 40 MCG/ML (10ML) SYRINGE FOR IV PUSH (FOR BLOOD PRESSURE SUPPORT)
PREFILLED_SYRINGE | INTRAVENOUS | Status: AC
  Filled 2018-03-22: qty 10

## 2018-03-22 MED ORDER — MORPHINE SULFATE (PF) 4 MG/ML IV SOLN: 4.0000 mg | INTRAVENOUS | Status: DC | PRN

## 2018-03-22 MED ORDER — PHENOL 1.4 % MT LIQD: 1.0000 | OROMUCOSAL | Status: DC | PRN

## 2018-03-22 MED ORDER — LACTATED RINGERS IV SOLN
INTRAVENOUS | Status: DC | PRN
  Administered 2018-03-22 (×2): via INTRAVENOUS

## 2018-03-22 MED ORDER — MIDAZOLAM HCL 2 MG/2ML IJ SOLN
INTRAMUSCULAR | Status: AC
  Filled 2018-03-22: qty 2

## 2018-03-22 MED ORDER — FENTANYL CITRATE (PF) 250 MCG/5ML IJ SOLN
INTRAMUSCULAR | Status: DC | PRN
  Administered 2018-03-22: 150 ug via INTRAVENOUS

## 2018-03-22 MED ORDER — ONDANSETRON HCL 4 MG/2ML IJ SOLN
INTRAMUSCULAR | Status: AC
  Filled 2018-03-22: qty 2

## 2018-03-22 MED ORDER — SODIUM CHLORIDE 0.9% FLUSH: 3.0000 mL | INTRAVENOUS | Status: DC | PRN

## 2018-03-22 MED ORDER — DEXAMETHASONE SODIUM PHOSPHATE 10 MG/ML IJ SOLN
INTRAMUSCULAR | Status: DC | PRN
  Administered 2018-03-22: 10 mg via INTRAVENOUS

## 2018-03-22 MED ORDER — ACETAMINOPHEN 500 MG PO TABS
1000.0000 mg | ORAL_TABLET | Freq: Four times a day (QID) | ORAL | Status: DC
  Administered 2018-03-22 – 2018-03-23 (×3): 1000 mg via ORAL
  Filled 2018-03-22 (×4): qty 2

## 2018-03-22 MED ORDER — HYDROMORPHONE HCL 1 MG/ML IJ SOLN
INTRAMUSCULAR | Status: AC
  Administered 2018-03-22: 0.5 mg via INTRAVENOUS
  Filled 2018-03-22: qty 1

## 2018-03-22 MED ORDER — SODIUM CHLORIDE 0.9% FLUSH
3.0000 mL | Freq: Two times a day (BID) | INTRAVENOUS | Status: DC
  Administered 2018-03-22: 3 mL via INTRAVENOUS

## 2018-03-22 MED ORDER — ONDANSETRON HCL 4 MG/2ML IJ SOLN: 4.0000 mg | Freq: Once | INTRAMUSCULAR | Status: DC | PRN

## 2018-03-22 MED ORDER — BUPIVACAINE LIPOSOME 1.3 % IJ SUSP
20.0000 mL | INTRAMUSCULAR | Status: AC
  Administered 2018-03-22: 20 mL
  Filled 2018-03-22: qty 20

## 2018-03-22 MED ORDER — CEFAZOLIN SODIUM-DEXTROSE 2-4 GM/100ML-% IV SOLN
INTRAVENOUS | Status: AC
  Filled 2018-03-22: qty 100

## 2018-03-22 MED ORDER — CHLORHEXIDINE GLUCONATE CLOTH 2 % EX PADS: 6.0000 | MEDICATED_PAD | Freq: Once | CUTANEOUS | Status: DC

## 2018-03-22 MED ORDER — CEFAZOLIN SODIUM-DEXTROSE 2-4 GM/100ML-% IV SOLN
2.0000 g | INTRAVENOUS | Status: AC
  Administered 2018-03-22: 2 g via INTRAVENOUS

## 2018-03-22 MED ORDER — THROMBIN 20000 UNITS EX SOLR
CUTANEOUS | Status: DC | PRN
  Administered 2018-03-22: 08:00:00 via TOPICAL

## 2018-03-22 MED ORDER — BUPIVACAINE-EPINEPHRINE (PF) 0.25% -1:200000 IJ SOLN
INTRAMUSCULAR | Status: DC | PRN
  Administered 2018-03-22: 10 mL via PERINEURAL

## 2018-03-22 MED ORDER — SODIUM CHLORIDE 0.9 % IV SOLN
INTRAVENOUS | Status: DC | PRN
  Administered 2018-03-22: 08:00:00

## 2018-03-22 MED ORDER — VITAMIN D 25 MCG (1000 UNIT) PO TABS: 1000.0000 [IU] | ORAL_TABLET | Freq: Every day | ORAL | Status: DC

## 2018-03-22 MED ORDER — BACITRACIN ZINC 500 UNIT/GM EX OINT
TOPICAL_OINTMENT | CUTANEOUS | Status: DC | PRN
  Administered 2018-03-22: 1 via TOPICAL

## 2018-03-22 MED ORDER — MEPERIDINE HCL 50 MG/ML IJ SOLN: 6.2500 mg | INTRAMUSCULAR | Status: DC | PRN

## 2018-03-22 MED ORDER — METOCLOPRAMIDE HCL 5 MG/ML IJ SOLN
INTRAMUSCULAR | Status: DC | PRN
  Administered 2018-03-22: 5 mg via INTRAVENOUS

## 2018-03-22 MED ORDER — LORATADINE 10 MG PO TABS
10.0000 mg | ORAL_TABLET | Freq: Every day | ORAL | Status: DC
  Filled 2018-03-22: qty 1

## 2018-03-22 SURGICAL SUPPLY — 75 items
ADH SKN CLS APL DERMABOND .7 (GAUZE/BANDAGES/DRESSINGS) ×1
APL SKNCLS STERI-STRIP NONHPOA (GAUZE/BANDAGES/DRESSINGS) ×1
BAG DECANTER FOR FLEXI CONT (MISCELLANEOUS) ×3 IMPLANT
BASKET BONE COLLECTION (BASKET) ×2 IMPLANT
BENZOIN TINCTURE PRP APPL 2/3 (GAUZE/BANDAGES/DRESSINGS) ×3 IMPLANT
BLADE CLIPPER SURG (BLADE) ×2 IMPLANT
BUR MATCHSTICK NEURO 3.0 LAGG (BURR) ×3 IMPLANT
BUR PRECISION FLUTE 6.0 (BURR) ×3 IMPLANT
CANISTER SUCT 3000ML PPV (MISCELLANEOUS) ×3 IMPLANT
CAP REVERE LOCKING (Cap) ×8 IMPLANT
CARTRIDGE OIL MAESTRO DRILL (MISCELLANEOUS) ×1 IMPLANT
CLOSURE WOUND 1/2 X4 (GAUZE/BANDAGES/DRESSINGS) ×1
CONT SPEC 4OZ CLIKSEAL STRL BL (MISCELLANEOUS) ×3 IMPLANT
COVER BACK TABLE 60X90IN (DRAPES) ×3 IMPLANT
COVER WAND RF STERILE (DRAPES) ×3 IMPLANT
DECANTER SPIKE VIAL GLASS SM (MISCELLANEOUS) ×3 IMPLANT
DERMABOND ADVANCED (GAUZE/BANDAGES/DRESSINGS) ×2
DERMABOND ADVANCED .7 DNX12 (GAUZE/BANDAGES/DRESSINGS) IMPLANT
DIFFUSER DRILL AIR PNEUMATIC (MISCELLANEOUS) ×3 IMPLANT
DRAPE C-ARM 42X72 X-RAY (DRAPES) ×6 IMPLANT
DRAPE HALF SHEET 40X57 (DRAPES) ×3 IMPLANT
DRAPE LAPAROTOMY 100X72X124 (DRAPES) ×3 IMPLANT
DRAPE SURG 17X23 STRL (DRAPES) ×12 IMPLANT
ELECT BLADE 4.0 EZ CLEAN MEGAD (MISCELLANEOUS) ×3
ELECT REM PT RETURN 9FT ADLT (ELECTROSURGICAL) ×3
ELECTRODE BLDE 4.0 EZ CLN MEGD (MISCELLANEOUS) ×1 IMPLANT
ELECTRODE REM PT RTRN 9FT ADLT (ELECTROSURGICAL) ×1 IMPLANT
EVACUATOR 1/8 PVC DRAIN (DRAIN) IMPLANT
GAUZE 4X4 16PLY RFD (DISPOSABLE) ×3 IMPLANT
GAUZE SPONGE 4X4 12PLY STRL (GAUZE/BANDAGES/DRESSINGS) ×3 IMPLANT
GLOVE BIO SURGEON STRL SZ 6.5 (GLOVE) ×1 IMPLANT
GLOVE BIO SURGEON STRL SZ8 (GLOVE) ×6 IMPLANT
GLOVE BIO SURGEON STRL SZ8.5 (GLOVE) ×6 IMPLANT
GLOVE BIO SURGEONS STRL SZ 6.5 (GLOVE) ×1
GLOVE BIOGEL PI IND STRL 6.5 (GLOVE) IMPLANT
GLOVE BIOGEL PI INDICATOR 6.5 (GLOVE) ×2
GLOVE ECLIPSE 8.0 STRL XLNG CF (GLOVE) ×4 IMPLANT
GLOVE EXAM NITRILE XL STR (GLOVE) IMPLANT
GLOVE SURG SS PI 6.0 STRL IVOR (GLOVE) ×2 IMPLANT
GLOVE SURG SS PI 6.5 STRL IVOR (GLOVE) ×2 IMPLANT
GLOVE SURG SS PI 7.0 STRL IVOR (GLOVE) ×6 IMPLANT
GOWN STRL REUS W/ TWL LRG LVL3 (GOWN DISPOSABLE) IMPLANT
GOWN STRL REUS W/ TWL XL LVL3 (GOWN DISPOSABLE) ×2 IMPLANT
GOWN STRL REUS W/TWL 2XL LVL3 (GOWN DISPOSABLE) IMPLANT
GOWN STRL REUS W/TWL LRG LVL3 (GOWN DISPOSABLE) ×6
GOWN STRL REUS W/TWL XL LVL3 (GOWN DISPOSABLE) ×9
HEMOSTAT POWDER KIT SURGIFOAM (HEMOSTASIS) ×3 IMPLANT
KIT BASIN OR (CUSTOM PROCEDURE TRAY) ×3 IMPLANT
KIT TURNOVER KIT B (KITS) ×3 IMPLANT
MILL MEDIUM DISP (BLADE) ×3 IMPLANT
NDL HYPO 21X1.5 SAFETY (NEEDLE) IMPLANT
NEEDLE HYPO 21X1.5 SAFETY (NEEDLE) ×3 IMPLANT
NEEDLE HYPO 22GX1.5 SAFETY (NEEDLE) ×3 IMPLANT
NS IRRIG 1000ML POUR BTL (IV SOLUTION) ×3 IMPLANT
OIL CARTRIDGE MAESTRO DRILL (MISCELLANEOUS) ×3
PACK LAMINECTOMY NEURO (CUSTOM PROCEDURE TRAY) ×3 IMPLANT
PAD ARMBOARD 7.5X6 YLW CONV (MISCELLANEOUS) ×9 IMPLANT
PATTIES SURGICAL .5 X1 (DISPOSABLE) IMPLANT
PATTIES SURGICAL 1X1 (DISPOSABLE) ×2 IMPLANT
PUTTY DBM 10CC CALC GRAN (Putty) ×2 IMPLANT
ROD REVERE 6.35 45MM (Rod) ×4 IMPLANT
SCREW 7.5X50MM (Screw) ×8 IMPLANT
SPACER ALTERA 10X31-15 (Spacer) ×2 IMPLANT
SPONGE LAP 4X18 RFD (DISPOSABLE) IMPLANT
SPONGE NEURO XRAY DETECT 1X3 (DISPOSABLE) IMPLANT
SPONGE SURGIFOAM ABS GEL 100 (HEMOSTASIS) ×2 IMPLANT
STRIP CLOSURE SKIN 1/2X4 (GAUZE/BANDAGES/DRESSINGS) ×2 IMPLANT
SUT VIC AB 1 CT1 18XBRD ANBCTR (SUTURE) ×2 IMPLANT
SUT VIC AB 1 CT1 8-18 (SUTURE) ×6
SUT VIC AB 2-0 CP2 18 (SUTURE) ×6 IMPLANT
SYR 20CC LL (SYRINGE) IMPLANT
TOWEL GREEN STERILE (TOWEL DISPOSABLE) ×3 IMPLANT
TOWEL GREEN STERILE FF (TOWEL DISPOSABLE) ×3 IMPLANT
TRAY FOLEY MTR SLVR 16FR STAT (SET/KITS/TRAYS/PACK) ×3 IMPLANT
WATER STERILE IRR 1000ML POUR (IV SOLUTION) ×3 IMPLANT

## 2018-03-22 NOTE — H&P (Signed)
Subjective: The patient is a 73 year old white male who has complained of back and leg pain consistent with neurogenic claudication.  He has failed medical management and was worked up with lumbar x-rays and lumbar MRI.  These demonstrated an L4-5 spondylolisthesis, and spinal stenosis.  I discussed the various treatment options with him.  He has decided to proceed with surgery.  Past Medical History:  Diagnosis Date  . Factor 5 Leiden mutation, heterozygous ([place])   . GERD (gastroesophageal reflux disease)   . History of chicken pox   . Hypertension   . Sciatic pain   . Synovial cyst of lumbar spine     Past Surgical History:  Procedure Laterality Date  . COLONOSCOPY  multiple  . TONSILLECTOMY AND ADENOIDECTOMY  1973    Allergies  Allergen Reactions  . Gelatin Other (See Comments)    ANY MEAT DERIVED PRODUCTS INCLUDING GELATIN, GELATIN CAPSULES, ETC.  . Meat [Alpha-Gal] Hives, Rash and Other (See Comments)    Beef, Lamb, Goat, Pork etc.    Social History   Tobacco Use  . Smoking status: Never Smoker  . Smokeless tobacco: Never Used  Substance Use Topics  . Alcohol use: Yes    Alcohol/week: 5.0 standard drinks    Types: 5 Standard drinks or equivalent per week    Comment: Wine    Family History  Problem Relation Age of Onset  . Arthritis Father   . Parkinson's disease Mother 63  . Prostate cancer Brother   . Colon cancer Neg Hx    Prior to Admission medications   Medication Sig Start Date End Date Taking? Authorizing Provider  apixaban (ELIQUIS) 5 MG TABS tablet Take 1 tablet (5 mg total) by mouth 2 (two) times daily. 11/11/17  Yes Hoyt Koch, MD  Cholecalciferol 2000 units TABS Take 1 tablet (2,000 Units total) by mouth daily. 08/25/17  Yes Janith Lima, MD  fexofenadine (ALLEGRA) 180 MG tablet Take 180 mg by mouth daily as needed for allergies or rhinitis.   Yes [provider]  MICARDIS 80 MG tablet TAKE 1 TABLET BY MOUTH DAILY 03/06/18  Yes  Hoyt Koch, MD  Multiple Vitamins-Minerals (MULTIVITAMIN PO) Take 1 tablet by mouth daily.   Yes [provider]  sildenafil (REVATIO) 20 MG tablet Take 4 tablets (80 mg total) by mouth daily as needed. Patient taking differently: Take 80 mg by mouth daily as needed (for ED).  08/24/17  Yes Janith Lima, MD  triamcinolone (NASACORT ALLERGY 24HR) 55 MCG/ACT AERO nasal inhaler Place 2 sprays into the nose daily as needed (for allergies).    Yes [provider]     Review of Systems  Positive ROS: As above  All other systems have been reviewed and were otherwise negative with the exception of those mentioned in the HPI and as above.  Objective: Vital signs in last 24 hours: Temp:  [97.7 F (36.5 C)] 97.7 F (36.5 C) (01/08 0651) Pulse Rate:  [71] 71 (01/08 0651) Resp:  [18] 18 (01/08 0651) BP: (133)/(80) 133/80 (01/08 0651) SpO2:  [95 %] 95 % (01/08 0651) Estimated body mass index is 28.19 kg/m as calculated from the following:   Height as of 03/13/18: 5\' 11"  (1.803 m).   Weight as of 03/13/18: 91.7 kg.   General Appearance: Alert Head: Normocephalic, without obvious abnormality, atraumatic Eyes: PERRL, conjunctiva/corneas clear, EOM's intact,    Ears: Normal  Throat: Normal  Neck: Supple, Back: unremarkable Lungs: Clear to auscultation bilaterally, respirations unlabored  Heart: Regular rate and rhythm, no murmur, rub or gallop Abdomen: Soft, non-tender Extremities: Extremities normal, atraumatic, no cyanosis or edema Skin: unremarkable  NEUROLOGIC:   Mental status: alert and oriented,Motor Exam - grossly normal Sensory Exam - grossly normal Reflexes:  Coordination - grossly normal Gait - grossly normal Balance - grossly normal Cranial Nerves: I: smell Not tested  II: visual acuity  OS: Normal  OD: Normal   II: visual fields Full to confrontation  II: pupils Equal, round, reactive to light  III,VII: ptosis None  III,IV,VI: extraocular  muscles  Full ROM  V: mastication Normal  V: facial light touch sensation  Normal  V,VII: corneal reflex  Present  VII: facial muscle function - upper  Normal  VII: facial muscle function - lower Normal  VIII: hearing Not tested  IX: soft palate elevation  Normal  IX,X: gag reflex Present  XI: trapezius strength  5/5  XI: sternocleidomastoid strength 5/5  XI: neck flexion strength  5/5  XII: tongue strength  Normal    Data Review Lab Results  Component Value Date   WBC 6.4 03/13/2018   HGB 15.9 03/13/2018   HCT 48.2 03/13/2018   MCV 93.6 03/13/2018   PLT 202 03/13/2018   Lab Results  Component Value Date   NA 139 03/13/2018   K 4.4 03/13/2018   CL 107 03/13/2018   CO2 22 03/13/2018   BUN 12 03/13/2018   CREATININE 1.19 03/13/2018   GLUCOSE 99 03/13/2018   Lab Results  Component Value Date   INR 1.00 03/22/2018    Assessment/Plan: L4-5 spondylolisthesis, spinal stenosis, facet arthropathy, lumbago, lumbar radiculopathy, neurogenic claudication: I have discussed the situation with the patient and reviewed his imaging studies with him.  We have discussed the various treatment options including surgery.  I have described the surgical treatment option L4-5 decompression, instrumentation and fusion.  I have shown him surgical models.  I have given him a surgical pamphlet.  We have discussed the risks, benefits, alternatives, expected postoperative course, and likelihood of achieving our goals.  I have answered all the patient's, and his wife's, questions.  He has decided to proceed with surgery.  He has stopped his anticoagulants.   Ophelia Charter 03/22/2018 8:20 AM

## 2018-03-22 NOTE — Progress Notes (Signed)
Subjective: The patient is alert and pleasant.  He looks well.  His wife is at the bedside.  Objective: Vital signs in last 24 hours: Temp:  [97.4 F (36.3 C)-98.2 F (36.8 C)] 97.4 F (36.3 C) (01/08 1348) Pulse Rate:  [71-91] 87 (01/08 1348) Resp:  [13-18] 18 (01/08 1348) BP: (131-145)/(74-88) 131/74 (01/08 1348) SpO2:  [94 %-95 %] 95 % (01/08 1348) Estimated body mass index is 28.19 kg/m as calculated from the following:   Height as of 03/13/18: 5\' 11"  (1.803 m).   Weight as of 03/13/18: 91.7 kg.   Intake/Output from previous day: No intake/output data recorded. Intake/Output this shift: Total I/O In: 1450 [I.V.:1200; IV Piggyback:250] Out: 750 [Urine:500; Blood:250]  Physical exam patient is alert and pleasant.  His strength is normal in his bile gastrocnemius and dorsiflexors.  Lab Results: No results for input(s): WBC, HGB, HCT, PLT in the last 72 hours. BMET No results for input(s): NA, K, CL, CO2, GLUCOSE, BUN, CREATININE, CALCIUM in the last 72 hours.  Studies/Results: Dg Lumbar Spine 2-3 Views  Result Date: 03/22/2018 CLINICAL DATA:  L4-5 PLIF. EXAM: DG C-ARM 61-120 MIN; LUMBAR SPINE - 2-3 VIEW COMPARISON:  Intraoperative radiographs same date. Lumbar MRI 10/18/2017. FINDINGS: Spot PA and lateral fluoroscopic images of the lower lumbar spine demonstrate the placement of bilateral pedicle screws at the L4 and L5 levels. The interconnecting rods have not yet been placed. Metallic interbody spacer is well positioned. No demonstrated complications. IMPRESSION: Intraoperative views during L4-5 PLIF. No demonstrated complications. Electronically Signed   By: Richardean Sale M.D.   On: 03/22/2018 12:41   Dg Lumbar Spine 1 View  Result Date: 03/22/2018 CLINICAL DATA:  Localization EXAM: LUMBAR SPINE - 1 VIEW COMPARISON:  10/18/2017 FINDINGS: Posterior surgical instrument is directed at the L4-5 level. IMPRESSION: Intraoperative localization as above. Electronically Signed    By: Rolm Baptise M.D.   On: 03/22/2018 11:38   Dg C-arm 1-60 Min  Result Date: 03/22/2018 CLINICAL DATA:  L4-5 PLIF. EXAM: DG C-ARM 61-120 MIN; LUMBAR SPINE - 2-3 VIEW COMPARISON:  Intraoperative radiographs same date. Lumbar MRI 10/18/2017. FINDINGS: Spot PA and lateral fluoroscopic images of the lower lumbar spine demonstrate the placement of bilateral pedicle screws at the L4 and L5 levels. The interconnecting rods have not yet been placed. Metallic interbody spacer is well positioned. No demonstrated complications. IMPRESSION: Intraoperative views during L4-5 PLIF. No demonstrated complications. Electronically Signed   By: Richardean Sale M.D.   On: 03/22/2018 12:41    Assessment/Plan: The patient is doing well.  LOS: 0 days     Ophelia Charter 03/22/2018, 3:39 PM

## 2018-03-22 NOTE — Op Note (Signed)
Brief history: The patient is a 73 year old white male who has complained of back and leg pain consistent with neurogenic claudication.  He has failed medical management and was worked up with lumbar x-rays and a lumbar MRI.  This demonstrated L4-5 spondylolisthesis, facet arthropathy, spinal stenosis, etc.  I discussed the various treatment option with the patient including surgery.  He has decided to proceed with an L4-5 decompression, instrumentation and fusion after weighing the risks, benefits and alternatives to surgery.  Preoperative diagnosis: L4-5 spondylolisthesis, degenerative disc disease, spinal stenosis compressing both the L4 and the L5 nerve roots; lumbago; lumbar radiculopathy; neurogenic claudication  Postoperative diagnosis: The same  Procedure: Bilateral L4-5 laminotomy/foraminotomies/medial facetectomy to decompress the bilateral L4-5 nerve roots(the work required to do this was in addition to the work required to do the posterior lumbar interbody fusion because of the patient's spinal stenosis, facet arthropathy. Etc. requiring a wide decompression of the nerve roots.);  L4-5 transforaminal lumbar interbody fusion with local morselized autograft bone and Kinnex graft extender; insertion of interbody prosthesis at L4-5 (globus peek expandable interbody prosthesis); posterior nonsegmental instrumentation from L4 to L5 with globus titanium pedicle screws and rods; posterior lateral arthrodesis at L4-5 bilaterally with local morselized autograft bone and Kinnex bone graft extender.  Surgeon: Dr. Earle Gell  Asst.: Arnetha Massy nurse practitioner  Anesthesia: Gen. endotracheal  Estimated blood loss: 200 cc  Drains: None  Complications: None  Description of procedure: The patient was brought to the operating room by the anesthesia team. General endotracheal anesthesia was induced. The patient was turned to the prone position on the Wilson frame. The patient's lumbosacral region  was then prepared with Betadine scrub and Betadine solution. Sterile drapes were applied.  I then injected the area to be incised with Marcaine with epinephrine solution. I then used the scalpel to make a linear midline incision over the L4-5 interspace. I then used electrocautery to perform a bilateral subperiosteal dissection exposing the spinous process and lamina of L4 and L5. We then obtained intraoperative radiograph to confirm our location. We then inserted the Verstrac retractor to provide exposure.  I began the decompression by using the high speed drill to perform laminotomies at L4-5 bilaterally. We then used the Kerrison punches to widen the laminotomy and removed the ligamentum flavum at L4-5 bilaterally. We used the Kerrison punches to remove the medial facets at L4-5 bilaterally. We performed wide foraminotomies about the bilateral L4 and L5 nerve roots completing the decompression.  We now turned our attention to the posterior lumbar interbody fusion. I used a scalpel to incise the intervertebral disc at L4-5 bilaterally. I then performed a partial intervertebral discectomy at L4-5 bilaterally using the pituitary forceps. We prepared the vertebral endplates at T0-2 bilaterally for the fusion by removing the soft tissues with the curettes. We then used the trial spacers to pick the appropriate sized interbody prosthesis. We prefilled his prosthesis with a combination of local morselized autograft bone that we obtained during the decompression as well as Kinnex bone graft extender. We inserted the prefilled prosthesis into the interspace at L4-5, we then turned and expanded the prosthesis. There was a good snug fit of the prosthesis in the interspace. We then filled and the remainder of the intervertebral disc space with local morselized autograft bone and Kinnex. This completed the posterior lumbar interbody arthrodesis.  We now turned attention to the instrumentation. Under fluoroscopic  guidance we cannulated the bilateral L4 and L5 pedicles with the bone probe. We then removed  the bone probe. We then tapped the pedicle with a 6.5 millimeter tap. We then removed the tap. We probed inside the tapped pedicle with a ball probe to rule out cortical breaches. We then inserted a 7.5 x 50 millimeter pedicle screw into the L4 and L5 pedicles bilaterally under fluoroscopic guidance. We then palpated along the medial aspect of the pedicles to rule out cortical breaches. There were none. The nerve roots were not injured. We then connected the unilateral pedicle screws with a lordotic rod. We compressed the construct and secured the rod in place with the caps. We then tightened the caps appropriately. This completed the instrumentation from L4-5 bilaterally.  We now turned our attention to the posterior lateral arthrodesis at L4-5 bilaterally. We used the high-speed drill to decorticate the remainder of the facets, pars, transverse process at L4-5 bilaterally. We then applied a combination of local morselized autograft bone and Kinnex bone graft extender over these decorticated posterior lateral structures. This completed the posterior lateral arthrodesis.  We then obtained hemostasis using bipolar electrocautery. We irrigated the wound out with bacitracin solution. We inspected the thecal sac and nerve roots and noted they were well decompressed. We then removed the retractor. We placed vancomycin powder in the wound.  We injected Exparel . We reapproximated patient's thoracolumbar fascia with interrupted #1 Vicryl suture. We reapproximated patient's subcutaneous tissue with interrupted 2-0 Vicryl suture. The reapproximated patient's skin with Steri-Strips and benzoin. The wound was then coated with bacitracin ointment. A sterile dressing was applied. The drapes were removed. The patient was subsequently returned to the supine position where they were extubated by the anesthesia team. He was then  transported to the post anesthesia care unit in stable condition. All sponge instrument and needle counts were reportedly correct at the end of this case.

## 2018-03-22 NOTE — Anesthesia Procedure Notes (Addendum)
Procedure Name: Intubation Date/Time: 03/22/2018 8:37 AM Performed by: Kathryne Hitch, CRNA Pre-anesthesia Checklist: Patient identified, Emergency Drugs available, Suction available, Patient being monitored and Timeout performed Patient Re-evaluated:Patient Re-evaluated prior to induction Oxygen Delivery Method: Circle system utilized Preoxygenation: Pre-oxygenation with 100% oxygen Induction Type: IV induction Ventilation: Mask ventilation without difficulty Laryngoscope Size: Miller and 2 Grade View: Grade I Tube type: Oral Tube size: 7.5 mm Number of attempts: 1 Airway Equipment and Method: Stylet Placement Confirmation: ETT inserted through vocal cords under direct vision,  positive ETCO2 and breath sounds checked- equal and bilateral Secured at: 23 cm Tube secured with: Tape Dental Injury: Teeth and Oropharynx as per pre-operative assessment

## 2018-03-22 NOTE — Transfer of Care (Signed)
Immediate Anesthesia Transfer of Care Note  Patient: Steven Hartman  Procedure(s) Performed: POSTERIOR LUMBAR INTERBODY FUSION, INTERBODY PROSTHESIS, POSTERIOR INSTRUMENTATION AND FUSION LUMBAR FOUR-LUMBAR FIVE (N/A )  Patient Location: PACU  Anesthesia Type:General  Level of Consciousness: drowsy and patient cooperative  Airway & Oxygen Therapy: Patient Spontanous Breathing and Patient connected to face mask oxygen  Post-op Assessment: Report given to RN and Post -op Vital signs reviewed and stable  Post vital signs: Reviewed and stable  Last Vitals:  Vitals Value Taken Time  BP 129/86 03/22/2018 12:17 PM  Temp    Pulse 81 03/22/2018 12:21 PM  Resp 14 03/22/2018 12:21 PM  SpO2 98 % 03/22/2018 12:21 PM  Vitals shown include unvalidated device data.  Last Pain:  Vitals:   03/22/18 0723  TempSrc:   PainSc: 0-No pain         Complications: No apparent anesthesia complications

## 2018-03-22 NOTE — Progress Notes (Signed)
Orthopedic Tech Progress Note Patient Details:  Steven Hartman [facility] 12-15-1945 [id number] Pt has brace before coming to the floor. Patient ID: [name], male   DOB: 02/11/1946, 73 y.o.   MRN: [id number]   [name] [name] 03/22/2018, 2:14 PM

## 2018-03-22 NOTE — Anesthesia Postprocedure Evaluation (Signed)
Anesthesia Post Note  Patient: Archer Moist Difranco  Procedure(s) Performed: POSTERIOR LUMBAR INTERBODY FUSION, INTERBODY PROSTHESIS, POSTERIOR INSTRUMENTATION AND FUSION LUMBAR FOUR-LUMBAR FIVE (N/A )     Patient location during evaluation: PACU Anesthesia Type: General Level of consciousness: awake and alert Pain management: pain level controlled Vital Signs Assessment: post-procedure vital signs reviewed and stable Respiratory status: spontaneous breathing, nonlabored ventilation, respiratory function stable and patient connected to nasal cannula oxygen Cardiovascular status: blood pressure returned to baseline and stable Postop Assessment: no apparent nausea or vomiting Anesthetic complications: no    Last Vitals:  Vitals:   03/22/18 1330 03/22/18 1348  BP:  131/74  Pulse: 90 87  Resp: 18 18  Temp: 36.8 C (!) 36.3 C  SpO2: 94% 95%    Last Pain:  Vitals:   03/22/18 1400  TempSrc:   PainSc: 3                  Janea Schwenn DAVID

## 2018-03-23 LAB — CBC
HEMATOCRIT: 44.6 % (ref 39.0–52.0)
Hemoglobin: 15 g/dL (ref 13.0–17.0)
MCH: 31.6 pg (ref 26.0–34.0)
MCHC: 33.6 g/dL (ref 30.0–36.0)
MCV: 93.9 fL (ref 80.0–100.0)
NRBC: 0 % (ref 0.0–0.2)
Platelets: 205 10*3/uL (ref 150–400)
RBC: 4.75 MIL/uL (ref 4.22–5.81)
RDW: 12.8 % (ref 11.5–15.5)
WBC: 17.4 10*3/uL — ABNORMAL HIGH (ref 4.0–10.5)

## 2018-03-23 LAB — BASIC METABOLIC PANEL
Anion gap: 8 (ref 5–15)
BUN: 15 mg/dL (ref 8–23)
CO2: 25 mmol/L (ref 22–32)
Calcium: 8.9 mg/dL (ref 8.9–10.3)
Chloride: 105 mmol/L (ref 98–111)
Creatinine, Ser: 1.21 mg/dL (ref 0.61–1.24)
GFR calc non Af Amer: >60 mL/min (ref >60)
Glucose, Bld: 130 mg/dL — ABNORMAL HIGH (ref 70–99)
Potassium: 4.1 mmol/L (ref 3.5–5.1)
Sodium: 138 mmol/L (ref 135–145)

## 2018-03-23 MED ORDER — OXYCODONE HCL 5 MG PO TABS: 5.0000 mg | ORAL_TABLET | ORAL | 0 refills | Status: DC | PRN

## 2018-03-23 MED ORDER — CYCLOBENZAPRINE HCL 10 MG PO TABS: 10.0000 mg | ORAL_TABLET | Freq: Three times a day (TID) | ORAL | 0 refills | Status: DC | PRN

## 2018-03-23 MED FILL — Thrombin (Recombinant) For Soln 5000 Unit: CUTANEOUS | Qty: 5000 | Status: AC

## 2018-03-23 MED FILL — Thrombin (Recombinant) For Soln 20000 Unit: CUTANEOUS | Qty: 1 | Status: AC

## 2018-03-23 NOTE — Progress Notes (Signed)
Pt doing well. Pt and wife given D/C instructions with verbal understanding. Rx's were sent to Pt's pharmacy by MD. Pt's incision is clean and dry with no sign of infection. Pt's IV was removed prior to D/C. Pt D/C'd home via walking @ 1030 per MD order. Pt is stable @ D/C and has no other needs at this time. Holli Humbles, RN

## 2018-03-23 NOTE — Discharge Instructions (Signed)
Spinal Fusion, Adult, Care After °This sheet gives you information about how to care for yourself after your procedure. Your doctor may also give you more specific instructions. If you have problems or questions, contact your doctor. °Follow these instructions at home: °Medicines °· Take over-the-counter and prescription medicines only as told by your doctor. These include any medicines for pain or blood-thinning medicines (anticoagulants). °· If you were prescribed an antibiotic medicine, take it as told by your doctor. Do not stop taking the antibiotic even if you start to feel better. °· Do not drive for 24 hours if you were given a medicine to help you relax (sedative) during your procedure. °· Do not drive or use heavy machinery while taking prescription pain medicine. °If you have a brace: °· Wear the brace as told by your doctor. Take it off only as told by your doctor. °· Keep the brace clean. °Managing pain, stiffness, and swelling °· If directed, put ice on the surgery area: °? If you have a removable brace, take it off as told by your doctor. °? Put ice in a plastic bag. °? Place a towel between your skin and the bag. °? Leave the ice on for 20 minutes, 2-3 times a day. °Surgery cut care ° °· Follow instructions from your doctor about how to take care of your cut from surgery (incision). Make sure you: °? Wash your hands with soap and water before you change your bandage (dressing). If you cannot use soap and water, use hand sanitizer. °? Change your bandage as told by your doctor. °? Leave stitches (sutures), skin glue, or skin tape (adhesive) strips in place. They may need to stay in place for 2 weeks or longer. If tape strips get loose and curl up, you may trim the loose edges. Do not remove tape strips completely unless your doctor says it is okay. °· Keep your cut from surgery clean and dry. °? Do not take baths, swim, or use a hot tub until your doctor says it is okay. °? Ask your doctor if you can  take showers. You may only be allowed to take sponge baths. °· Every day, check your cut from surgery and the area around it for: °? More redness, swelling, or pain. °? Fluid or blood. °? Warmth. °? Pus or a bad smell. °· If you have a drain tube, follow instructions from your doctor about caring for it. Do not take out the drain tube or any bandages unless your doctor says it is okay. °Physical activity °· Rest and protect your back as much as possible. °· Follow instructions from your doctor about how to move. Use good posture to help your spine heal. °· Do not lift anything that is heavier than 8 lb (3.6 kg), or the limit that you are told, until your doctor says that it is safe. °· Do not twist or bend at the waist until your doctor says it is okay. °· It is best if you: °? Do not make pushing and pulling motions. °? Do not sit or lie down in the same position for a long time. °? Do not raise your hands or arms above your head. °· Return to your normal activities as told by your doctor. Ask your doctor what activities are safe for you. Rest and protect your back as much as you can. °· Do not start to exercise until your doctor says it is okay. Ask your doctor what kinds of exercise you can do   to make your back stronger. °General instructions °· To prevent blood clots and lessen swelling in your legs: °? Wear compression stockings as told. °? Walk one or more times every few hours as told by your doctor. °· Do not use any products that contain nicotine or tobacco, such as cigarettes and e-cigarettes. These can delay bone healing. If you need help quitting, ask your doctor. °· To prevent or treat constipation while you are taking prescription pain medicine, your doctor may suggest that you: °? Drink enough fluid to keep your pee (urine) pale yellow. °? Take over-the-counter or prescription medicines. °? Eat foods that are high in fiber. These include fresh fruits and vegetables, whole grains, and beans. °? Limit  foods that are high in fat and processed sugars, such as fried and sweet foods. °· Keep all follow-up visits as told by your doctor. This is important. °Contact a doctor if: °· Your pain gets worse. °· Your medicine does not help your pain. °· Your legs or feet get painful or swollen. °· Your cut from surgery is more red, swollen, or painful. °· Your cut from surgery feels warm to the touch. °· You have: °? Fluid or blood coming from your cut from surgery. °? Pus or a bad smell coming from your cut from surgery. °? A fever. °? Weakness or loss of feeling (numbness) in your legs that is new or getting worse. °? Trouble controlling when you pee (urinate) or poop (have a bowel movement). °· You feel sick to your stomach (nauseous). °· You throw up (vomit). °Get help right away if: °· Your pain is very bad. °· You have chest pain. °· You have trouble breathing. °· You start to have a cough. °These symptoms may be an emergency. Do not wait to see if the symptoms will go away. Get medical help right away. Call your local emergency services (911 in the [place] Do not drive yourself to the hospital. °Summary °· After the procedure, it is common to have pain in your back and pain by your surgery cut(s). °· Icing and pain medicines may help to control the pain. Follow directions from your doctor. °· Rest and protect your back as much as possible. Do not twist or bend at the waist. °· Get up and walk one or more times every few hours as told by your doctor. °This information is not intended to replace advice given to you by your health care provider. Make sure you discuss any questions you have with your health care provider. °Document Released: 06/25/2010 Document Revised: 06/15/2016 Document Reviewed: 06/15/2016 °Elsevier Interactive Patient Education © 2019 [employer] ° °

## 2018-03-23 NOTE — Evaluation (Signed)
Physical Therapy Evaluation and Discharge Patient Details Name: Steven Hartman MRN: [id number] DOB: April 05, 1945 Today's Date: 03/23/2018   History of Present Illness  This 73 y.o. male admitted for L4-5 PLIF. PMH includes: h/o DVT  Clinical Impression  Patient evaluated by Physical Therapy with no further acute PT needs identified. All education has been completed and the patient has no further questions. At the time of PT eval pt was able to perform transfers and ambulation with gross modified independence. Pt educated on precautions, car transfer, brace application/wearing schedule, and activity progression. See below for any follow-up Physical Therapy or equipment needs. PT is signing off. Thank you for this referral.     Follow Up Recommendations No PT follow up;Supervision - Intermittent    Equipment Recommendations  None recommended by PT    Recommendations for Other Services       Precautions / Restrictions Precautions Precautions: Back Precaution Booklet Issued: Yes (comment) Precaution Comments: reviewed back handout.  He was able to verbalize understanding of back precautions  Required Braces or Orthoses: Spinal Brace Spinal Brace: Lumbar corset;Applied in sitting position Restrictions Weight Bearing Restrictions: No      Mobility  Bed Mobility               General bed mobility comments: Pt sitting EOB finishing breakfast when PT arrived.  Transfers Overall transfer level: Modified independent Equipment used: None Transfers: Sit to/from Stand Sit to Stand: Modified independent (Device/Increase time)            Ambulation/Gait Ambulation/Gait assistance: Modified independent (Device/Increase time) Gait Distance (Feet): 200 Feet Assistive device: None Gait Pattern/deviations: Step-through pattern;Decreased stride length Gait velocity: Decreased Gait velocity interpretation: 1.31 - 2.62 ft/sec, indicative of limited community ambulator General  Gait Details: Pt ambulating well without overt LOB.   Stairs Stairs: Yes Stairs assistance: Min guard;Supervision Stair Management: No rails;Step to pattern;Alternating pattern;Forwards Number of Stairs: 13 General stair comments: Initially with railing and was able to perform alternating step pattern. Without railing pt required step-to gait pattern.   Wheelchair Mobility    Modified Rankin (Stroke Patients Only)       Balance Overall balance assessment: Mild deficits observed, not formally tested                                           Pertinent Vitals/Pain Pain Assessment: 0-10 Pain Score: 4  Pain Location: back  Pain Descriptors / Indicators: Operative site guarding Pain Intervention(s): Monitored during session    Home Living Family/patient expects to be discharged to:: Private residence Living Arrangements: Spouse/significant other;Children Available Help at Discharge: Family;Available PRN/intermittently;Available 24 hours/day Type of Home: House Home Access: Stairs to enter Entrance Stairs-Rails: None Entrance Stairs-Number of Steps: 3 Home Layout: One level Home Equipment: None      Prior Function Level of Independence: Independent         Comments: Pt works in Ha[employer]   Dominant Hand: Right    Extremity/Trunk Assessment   Upper Extremity Assessment Upper Extremity Assessment: Overall WFL for tasks assessed    Lower Extremity Assessment Lower Extremity Assessment: LLE deficits/detail LLE Deficits / Details: Decreased DF noted    Cervical / Trunk Assessment Cervical / Trunk Assessment: Other exceptions Cervical / Trunk Exceptions: s/p surgery  Communication   Communication: No difficulties  Cognition Arousal/Alertness: Awake/alert Behavior During Therapy: WFL for  tasks assessed/performed Overall Cognitive Status: Within Functional Limits for tasks assessed                                         General Comments General comments (skin integrity, edema, etc.): wife supportive and will be taking off time from work as needed to assist pt    Exercises     Assessment/Plan    PT Assessment Patent does not need any further PT services  PT Problem List         PT Treatment Interventions      PT Goals (Current goals can be found in the Care Plan section)  Acute Rehab PT Goals Patient Stated Goal: to get better and eventually back to work  PT Goal Formulation: All assessment and education complete, DC therapy    Frequency     Barriers to discharge        Co-evaluation               AM-PAC PT "6 Clicks" Mobility  Outcome Measure Help needed turning from your back to your side while in a flat bed without using bedrails?: None Help needed moving from lying on your back to sitting on the side of a flat bed without using bedrails?: None Help needed moving to and from a bed to a chair (including a wheelchair)?: None Help needed standing up from a chair using your arms (e.g., wheelchair or bedside chair)?: None Help needed to walk in hospital room?: None Help needed climbing 3-5 steps with a railing? : A Little 6 Click Score: 23    End of Session Equipment Utilized During Treatment: Gait belt Activity Tolerance: Patient tolerated treatment well Patient left: in chair;with call bell/phone within reach;with family/visitor present Nurse Communication: Mobility status PT Visit Diagnosis: Pain Pain - part of body: (back)    Time: 1505-6979 PT Time Calculation (min) (ACUTE ONLY): 22 min   Charges:   PT Evaluation $PT Eval Low Complexity: 1 Low          Rolinda Roan, PT, DPT Acute Rehabilitation Services Pager: [phone] Office: [phone]   Thelma Comp 03/23/2018, 2:11 PM

## 2018-03-23 NOTE — Evaluation (Signed)
Occupational Therapy Evaluation Patient Details Name: Steven Hartman MRN: [id number] DOB: 1945-12-21 Today's Date: 03/23/2018    History of Present Illness This 73 y.o. male admitted for L4-5 PLIF. PMH includes: h/o DVT   Clinical Impression   Patient evaluated by Occupational Therapy with no further acute OT needs identified. All education has been completed and the patient has no further questions. All education completed.   Pt requires min A for LB ADLs, but wife able to assist as needed.  Reviewed use of AE.  See below for any follow-up Occupational Therapy or equipment needs. OT is signing off. Thank you for this referral.      Follow Up Recommendations  No OT follow up;Supervision - Intermittent    Equipment Recommendations  None recommended by OT    Recommendations for Other Services       Precautions / Restrictions Precautions Precautions: Back Precaution Booklet Issued: Yes (comment) Precaution Comments: reviewed back handout.  He was able to verbalize understanding of back precautions  Required Braces or Orthoses: Spinal Brace Spinal Brace: Lumbar corset;Applied in sitting position Restrictions Weight Bearing Restrictions: No      Mobility Bed Mobility               General bed mobility comments: Pt sitting up in recliner   Transfers Overall transfer level: Needs assistance   Transfers: Sit to/from Stand;Stand Pivot Transfers Sit to Stand: Supervision Stand pivot transfers: Supervision       General transfer comment: supervision for safety     Balance Overall balance assessment: Mild deficits observed, not formally tested                                         ADL either performed or assessed with clinical judgement   ADL Overall ADL's : Needs assistance/impaired Eating/Feeding: Independent   Grooming: Wash/dry hands;Wash/dry face;Oral care;Brushing hair;Supervision/safety;Standing Grooming Details (indicate cue type  and reason): reviewed precautions and to avoid bending when shaving and brushing teeth  Upper Body Bathing: Supervision/ safety;Sitting   Lower Body Bathing: Minimal assistance;Sit to/from stand Lower Body Bathing Details (indicate cue type and reason): assist to wash feet. reviewed use of LH sponge  Upper Body Dressing : Supervision/safety   Lower Body Dressing: Minimal assistance;Sit to/from stand Lower Body Dressing Details (indicate cue type and reason): will require assist to don compression stockings and wife able to assist.  Reviewed use of reacher and LH shoe horn  Toilet Transfer: Supervision/safety;Comfort height toilet;Grab bars   Toileting- Clothing Manipulation and Hygiene: Supervision/safety;Sit to/from stand       Functional mobility during ADLs: Supervision/safety General ADL Comments: reviewed safe technique for all ADL activitities      Vision Baseline Vision/History: Wears glasses Wears Glasses: At all times       Perception     Praxis      Pertinent Vitals/Pain Pain Assessment: 0-10 Pain Score: 4  Pain Location: back  Pain Descriptors / Indicators: Operative site guarding Pain Intervention(s): Monitored during session     Hand Dominance Right   Extremity/Trunk Assessment Upper Extremity Assessment Upper Extremity Assessment: Overall WFL for tasks assessed   Lower Extremity Assessment Lower Extremity Assessment: Defer to PT evaluation       Communication Communication Communication: No difficulties   Cognition Arousal/Alertness: Awake/alert Behavior During Therapy: WFL for tasks assessed/performed Overall Cognitive Status: Within Functional Limits for tasks assessed  General Comments  wife supportive and will be taking off time from work as needed to assist pt     Exercises     Shoulder [place] expects to be discharged to:: Private residence Living  Arrangements: Spouse/significant other;Children Available Help at Discharge: Family;Available PRN/intermittently;Available 24 hours/day Type of Home: House             Bathroom Shower/Tub: Walk-in Psychologist, prison and probation services: Standard                Prior Functioning/Environment Level of Independence: Independent        Comments: Pt works in Pharmacist, community Problem List: Pain;Decreased knowledge of precautions      OT Treatment/Interventions:      OT Goals(Current goals can be found in the care plan section) Acute Rehab OT Goals Patient Stated Goal: to get better and eventually back to work  OT Goal Formulation: All assessment and education complete, DC therapy  OT Frequency:     Barriers to D/C:            Co-evaluation              AM-PAC OT "6 Clicks" Daily Activity     Outcome Measure Help from another person eating meals?: None Help from another person taking care of personal grooming?: None Help from another person toileting, which includes using toliet, bedpan, or urinal?: None Help from another person bathing (including washing, rinsing, drying)?: A Little Help from another person to put on and taking off regular upper body clothing?: None Help from another person to put on and taking off regular lower body clothing?: A Little 6 Click Score: 22   End of Session Equipment Utilized During Treatment: Back brace Nurse Communication: Mobility status  Activity Tolerance: Patient tolerated treatment well Patient left: in chair;Other (comment)(sitting EOB )  OT Visit Diagnosis: Pain                Time: 9678-9381 OT Time Calculation (min): 49 min Charges:  OT General Charges $OT Visit: 1 Visit OT Evaluation $OT Eval Low Complexity: 1 Low OT Treatments $Self Care/Home Management : 23-37 mins  Lucille Passy, OTR/L Acute Rehabilitation Services Pager [phone] Office [phone]   Lucille Passy M 03/23/2018, 10:36 AM

## 2018-03-23 NOTE — Discharge Summary (Signed)
Physician Discharge Summary  Patient ID: Steven Hartman MRN: [id number] DOB/AGE: [birth year] 71 y.o.  Admit date: 03/22/2018 Discharge date: 03/23/2018  Admission Diagnoses: Lumbar spondylolisthesis, lumbar facet arthropathy, lumbar spinal stenosis with neurogenic claudication, lumbago, lumbar radiculopathy  Discharge Diagnoses: The same Active Problems:   Spondylolisthesis, lumbar region   Discharged Condition: good  Hospital Course: I performed an L4-5 decompression, instrumentation and fusion on the patient on 03/22/2018.  The patient's postoperative course was unremarkable.  On postoperative day #1 the patient requested discharge to home.  The patient, and his wife, were given oral and written discharge instructions.  All their questions were answered.  We discussed thoroughly when to restart his Pradaxa in light of his history of DVT and factor V Leiden.  We discussed the pros and cons of starting it too soon or too late.  I instructed to resume his anticoagulant on 03/25/2018.  Consults: Physical therapy, Occupational Therapy Significant Diagnostic Studies: None Treatments: L4-5 decompression, instrumentation and fusion. Discharge Exam: Blood pressure (!) 107/57, pulse 86, temperature (!) 97.5 F (36.4 C), temperature source Oral, resp. rate 18, SpO2 97 %. Patient is alert and pleasant.  He looks well.  His lower extremity strength is grossly normal.  Disposition: Home  Discharge Instructions    Call MD for:  difficulty breathing, headache or visual disturbances   Complete by:  As directed    Call MD for:  extreme fatigue   Complete by:  As directed    Call MD for:  hives   Complete by:  As directed    Call MD for:  persistant dizziness or light-headedness   Complete by:  As directed    Call MD for:  persistant nausea and vomiting   Complete by:  As directed    Call MD for:  redness, tenderness, or signs of infection (pain, swelling, redness, odor or green/yellow  discharge around incision site)   Complete by:  As directed    Call MD for:  severe uncontrolled pain   Complete by:  As directed    Call MD for:  temperature >100.4   Complete by:  As directed    Diet - low sodium heart healthy   Complete by:  As directed    Discharge instructions   Complete by:  As directed    Call [phone] for a followup appointment. Take a stool softener while you are using pain medications.   Driving Restrictions   Complete by:  As directed    Do not drive for 2 weeks.   Increase activity slowly   Complete by:  As directed    Lifting restrictions   Complete by:  As directed    Do not lift more than 5 pounds. No excessive bending or twisting.   May shower / Bathe   Complete by:  As directed    Remove the dressing for 3 days after surgery.  You may shower, but leave the incision alone.   Remove dressing in 48 hours   Complete by:  As directed    Your stitches are under the scan and will dissolve by themselves. The Steri-Strips will fall off after you take a few showers. Do not rub back or pick at the wound, Leave the wound alone.     Allergies as of 03/23/2018      Reactions   Gelatin Other (See Comments)   ANY MEAT DERIVED PRODUCTS INCLUDING GELATIN, GELATIN CAPSULES, ETC.   Meat [alpha-gal] Hives, Rash, Other (See Comments)   Beef,  Lamb, Goat, Pork etc.      Medication List    TAKE these medications   apixaban 5 MG Tabs tablet Commonly known as:  ELIQUIS Take 1 tablet (5 mg total) by mouth 2 (two) times daily.   Cholecalciferol 50 MCG (2000 UT) Tabs Take 1 tablet (2,000 Units total) by mouth daily.   cyclobenzaprine 10 MG tablet Commonly known as:  FLEXERIL Take 1 tablet (10 mg total) by mouth 3 (three) times daily as needed for muscle spasms.   fexofenadine 180 MG tablet Commonly known as:  ALLEGRA Take 180 mg by mouth daily as needed for allergies or rhinitis.   MICARDIS 80 MG tablet Generic drug:  telmisartan TAKE 1 TABLET BY MOUTH  DAILY   MULTIVITAMIN PO Take 1 tablet by mouth daily.   NASACORT ALLERGY 24HR 55 MCG/ACT Aero nasal inhaler Generic drug:  triamcinolone Place 2 sprays into the nose daily as needed (for allergies).   oxyCODONE 5 MG immediate release tablet Commonly known as:  Oxy IR/ROXICODONE Take 1 tablet (5 mg total) by mouth every 4 (four) hours as needed for moderate pain ((score 4 to 6)).   sildenafil 20 MG tablet Commonly known as:  REVATIO Take 4 tablets (80 mg total) by mouth daily as needed. What changed:  reasons to take this        Signed: Ophelia Charter 03/23/2018, 9:35 AM

## 2018-03-24 MED FILL — Heparin Sodium (Porcine) Inj 1000 Unit/ML: INTRAMUSCULAR | Qty: 30 | Status: AC

## 2018-03-24 MED FILL — Sodium Chloride IV Soln 0.9%: INTRAVENOUS | Qty: 1000 | Status: AC

## 2018-03-26 ENCOUNTER — Emergency Department (HOSPITAL_COMMUNITY): Payer: 59

## 2018-03-26 ENCOUNTER — Emergency Department (HOSPITAL_COMMUNITY)
Admission: EM | Admit: 2018-03-26 | Discharge: 2018-03-26 | Disposition: A | Discharge status: Home / Self Care | Payer: 59 | Attending: Emergency Medicine | Admitting: Emergency Medicine

## 2018-03-26 ENCOUNTER — Other Ambulatory Visit: Payer: Self-pay

## 2018-03-26 ENCOUNTER — Encounter (HOSPITAL_COMMUNITY): Payer: Self-pay | Admitting: *Deleted

## 2018-03-26 DIAGNOSIS — I10 Essential (primary) hypertension: Secondary | ICD-10-CM | POA: Insufficient documentation

## 2018-03-26 DIAGNOSIS — Z79899 Other long term (current) drug therapy: Secondary | ICD-10-CM | POA: Diagnosis not present

## 2018-03-26 DIAGNOSIS — R2242 Localized swelling, mass and lump, left lower limb: Secondary | ICD-10-CM | POA: Diagnosis not present

## 2018-03-26 DIAGNOSIS — R229 Localized swelling, mass and lump, unspecified: Secondary | ICD-10-CM

## 2018-03-26 LAB — CBC WITH DIFFERENTIAL/PLATELET
Abs Immature Granulocytes: 0.07 10*3/uL (ref 0.00–0.07)
Basophils Absolute: 0 10*3/uL (ref 0.0–0.1)
Basophils Relative: 0 %
EOS ABS: 0.4 10*3/uL (ref 0.0–0.5)
Eosinophils Relative: 4 %
HEMATOCRIT: 40.4 % (ref 39.0–52.0)
Hemoglobin: 13.6 g/dL (ref 13.0–17.0)
Immature Granulocytes: 1 %
Lymphocytes Relative: 12 %
Lymphs Abs: 1.1 10*3/uL (ref 0.7–4.0)
MCH: 31.6 pg (ref 26.0–34.0)
MCHC: 33.7 g/dL (ref 30.0–36.0)
MCV: 94 fL (ref 80.0–100.0)
Monocytes Absolute: 1.2 10*3/uL — ABNORMAL HIGH (ref 0.1–1.0)
Monocytes Relative: 14 %
Neutro Abs: 6.3 10*3/uL (ref 1.7–7.7)
Neutrophils Relative %: 69 %
Platelets: 214 10*3/uL (ref 150–400)
RBC: 4.3 MIL/uL (ref 4.22–5.81)
RDW: 12.5 % (ref 11.5–15.5)
WBC: 9.1 10*3/uL (ref 4.0–10.5)
nRBC: 0 % (ref 0.0–0.2)

## 2018-03-26 LAB — BASIC METABOLIC PANEL
Anion gap: 12 (ref 5–15)
BUN: 15 mg/dL (ref 8–23)
CALCIUM: 8.2 mg/dL — AB (ref 8.9–10.3)
CO2: 23 mmol/L (ref 22–32)
Chloride: 101 mmol/L (ref 98–111)
Creatinine, Ser: 1.29 mg/dL — ABNORMAL HIGH (ref 0.61–1.24)
GFR calc Af Amer: >60 mL/min (ref >60)
GFR calc non Af Amer: 56 mL/min — ABNORMAL LOW (ref >60)
Glucose, Bld: 107 mg/dL — ABNORMAL HIGH (ref 70–99)
Potassium: 4 mmol/L (ref 3.5–5.1)
Sodium: 136 mmol/L (ref 135–145)

## 2018-03-26 LAB — PROTIME-INR
INR: 1.19
Prothrombin Time: 15 seconds (ref 11.4–15.2)

## 2018-03-26 MED ORDER — DOXYCYCLINE HYCLATE 100 MG PO TABS
100.0000 mg | ORAL_TABLET | Freq: Once | ORAL | Status: AC
  Administered 2018-03-26: 100 mg via ORAL
  Filled 2018-03-26: qty 1

## 2018-03-26 MED ORDER — MORPHINE SULFATE (PF) 4 MG/ML IV SOLN: 4.0000 mg | Freq: Once | INTRAVENOUS | Status: DC

## 2018-03-26 MED ORDER — SODIUM CHLORIDE 0.9 % IV BOLUS
500.0000 mL | Freq: Once | INTRAVENOUS | Status: AC
  Administered 2018-03-26: 500 mL via INTRAVENOUS

## 2018-03-26 MED ORDER — OXYCODONE HCL 5 MG PO TABS
5.0000 mg | ORAL_TABLET | Freq: Once | ORAL | Status: AC
  Administered 2018-03-26: 5 mg via ORAL
  Filled 2018-03-26: qty 1

## 2018-03-26 MED ORDER — IOHEXOL 300 MG/ML  SOLN
100.0000 mL | Freq: Once | INTRAMUSCULAR | Status: AC | PRN
  Administered 2018-03-26: 100 mL via INTRAVENOUS

## 2018-03-26 MED ORDER — DOXYCYCLINE HYCLATE 100 MG PO CAPS: 100.0000 mg | ORAL_CAPSULE | Freq: Two times a day (BID) | ORAL | 0 refills | Status: AC

## 2018-03-26 NOTE — Discharge Instructions (Addendum)
You have been diagnosed today with soft tissue swelling of the left hip and leg.  At this time there does not appear to be the presence of an emergent medical condition, however there is always the potential for conditions to change. Please read and follow the below instructions.  Please return to the Emergency Department immediately for any new or worsening symptoms. Please be sure to follow up with your Primary Care Provider this week regarding your visit today; please call their office to schedule an appointment even if you are feeling better for a follow-up visit. Please call and see your neurosurgeon tomorrow morning at their office for further evaluation. You have been given your first dose of antibiotic doxycycline today.  You may continue using this medication as prescribed to avoid infection, discussed this antibiotic choice with your neurosurgeon tomorrow. You may continue to use your other medications as prescribed by your neurosurgeon and primary care provider.  Return to the emergency department if: You develop any new or worsening symptoms You have fever You have new numbness, tingling or weakness You have nausea or vomiting Your swelling returns or gets worse  Please read the additional information packets attached to your discharge summary.  Do not take your medicine if  develop an itchy rash, swelling in your mouth or lips, or difficulty breathing.

## 2018-03-26 NOTE — ED Notes (Signed)
Patient transported to CT 

## 2018-03-26 NOTE — ED Provider Notes (Signed)
[facility] EMERGENCY DEPARTMENT Provider Note   CSN: [id number] Arrival date & time: 03/26/18  1851     History   Chief Complaint Chief Complaint  Patient presents with  . Leg Swelling    HPI Steven Hartman is a 73 y.o. male with history of factor V Leiden, DVT, sciatica, status post posterior lumbar interbody fusion and prosthesis on 03/22/2018.  Patient states that he has been feeling well since time of surgery however approximately 1 hour prior to arrival he noticed swelling to his left hip and left lateral upper thigh with some associated tingling to these areas.  Upon my initial evaluation patient is laying flat in bed, he states that he is tingling sensation has resolved and that the swelling on his left hip and thigh have improved since time of onset.  Patient states that he is feeling well however does endorse some mild pain to his incision site midline lumbar spine that has not changed since his surgery.  Patient denies history of fever, nausea/vomiting, bowel/bladder incontinence, saddle area paresthesias, vision changes, neck pain or any additional concerns at this time.  Patient states that he was briefly taken off his Eliquis prior to surgery however restarted Eliquis yesterday morning.  HPI  Past Medical History:  Diagnosis Date  . Factor 5 Leiden mutation, heterozygous ([place])   . GERD (gastroesophageal reflux disease)   . History of chicken pox   . Hypertension   . Sciatic pain   . Synovial cyst of lumbar spine     Patient Active Problem List   Diagnosis Date Noted  . Spondylolisthesis, lumbar region 03/22/2018  . Acute deep vein thrombosis (DVT) of left lower extremity ([place]) 11/11/2017  . Allergy to alpha-gal 11/10/2017  . Vitamin D deficiency 08/25/2017  . ECM (erythema chronicum migrans) 08/24/2017  . Need for hepatitis C screening test 08/24/2017  . Erectile dysfunction due to arterial insufficiency 08/24/2017  . Essential  hypertension 07/11/2014  . Routine general medical examination at a health care facility 04/24/2013  . Personal history of colonic polyps 09/30/2009  . BACK PAIN WITH RADICULOPATHY 05/19/2007    Past Surgical History:  Procedure Laterality Date  . COLONOSCOPY  multiple  . TONSILLECTOMY AND ADENOIDECTOMY  1973      Home Medications    Prior to Admission medications   Medication Sig Start Date End Date Taking? Authorizing Provider  apixaban (ELIQUIS) 5 MG TABS tablet Take 1 tablet (5 mg total) by mouth 2 (two) times daily. 11/11/17   Hoyt Koch, MD  Cholecalciferol 2000 units TABS Take 1 tablet (2,000 Units total) by mouth daily. 08/25/17   Janith Lima, MD  cyclobenzaprine (FLEXERIL) 10 MG tablet Take 1 tablet (10 mg total) by mouth 3 (three) times daily as needed for muscle spasms. 03/23/18   Newman Pies, MD  doxycycline (VIBRAMYCIN) 100 MG capsule Take 1 capsule (100 mg total) by mouth 2 (two) times daily for 7 days. 03/26/18 04/02/18  Nuala Alpha A, PA-C  fexofenadine (ALLEGRA) 180 MG tablet Take 180 mg by mouth daily as needed for allergies or rhinitis.    [provider]  MICARDIS 80 MG tablet TAKE 1 TABLET BY MOUTH DAILY 03/06/18   Hoyt Koch, MD  Multiple Vitamins-Minerals (MULTIVITAMIN PO) Take 1 tablet by mouth daily.    [provider]  oxyCODONE (OXY IR/ROXICODONE) 5 MG immediate release tablet Take 1 tablet (5 mg total) by mouth every 4 (four) hours as needed for moderate pain ((score  4 to 6)). 03/23/18   Newman Pies, MD  sildenafil (REVATIO) 20 MG tablet Take 4 tablets (80 mg total) by mouth daily as needed. Patient taking differently: Take 80 mg by mouth daily as needed (for ED).  08/24/17   Janith Lima, MD  triamcinolone (NASACORT ALLERGY 24HR) 55 MCG/ACT AERO nasal inhaler Place 2 sprays into the nose daily as needed (for allergies).     [provider]    Family History Family History  Problem Relation Age  of Onset  . Arthritis Father   . Parkinson's disease Mother 81  . Prostate cancer Brother   . Colon cancer Neg Hx     Social History Social History   Tobacco Use  . Smoking status: Never Smoker  . Smokeless tobacco: Never Used  Substance Use Topics  . Alcohol use: Yes    Alcohol/week: 5.0 standard drinks    Types: 5 Standard drinks or equivalent per week    Comment: Wine  . Drug use: No     Allergies   Gelatin and Meat [alpha-gal]   Review of Systems Review of Systems  Constitutional: Negative.  Negative for chills and fever.  Eyes: Negative.  Negative for visual disturbance.  Respiratory: Negative.  Negative for cough and shortness of breath.   Cardiovascular: Negative.  Negative for chest pain.  Gastrointestinal: Negative.  Negative for abdominal pain, diarrhea, nausea and vomiting.  Genitourinary: Negative.  Negative for dysuria and hematuria.  Musculoskeletal: Positive for back pain and joint swelling. Negative for neck pain and neck stiffness.  Skin: Negative.  Negative for color change and rash.  Neurological: Positive for numbness (Of left hip resolved prior to evaluation). Negative for dizziness, weakness and headaches.  All other systems reviewed and are negative.  Physical Exam Updated Vital Signs BP 127/75   Pulse 91   Temp 98.1 F (36.7 C) (Oral)   Resp 16   Ht 5\' 11"  (1.803 m)   Wt 91.7 kg   SpO2 100%   BMI 28.20 kg/m   Physical Exam Constitutional:      General: He is not in acute distress.    Appearance: He is well-developed.  HENT:     Head: Normocephalic and atraumatic.     Right Ear: External ear normal.     Left Ear: External ear normal.     Nose: Nose normal.     Mouth/Throat:     Lips: Pink.     Mouth: Mucous membranes are moist.     Pharynx: Oropharynx is clear. Uvula midline.  Eyes:     General: Vision grossly intact. Gaze aligned appropriately.     Extraocular Movements: Extraocular movements intact.     Conjunctiva/sclera:  Conjunctivae normal.     Pupils: Pupils are equal, round, and reactive to light.  Neck:     Musculoskeletal: Full passive range of motion without pain, normal range of motion and neck supple.     Trachea: Trachea and phonation normal. No tracheal deviation.  Cardiovascular:     Rate and Rhythm: Normal rate and regular rhythm.     Pulses:          Dorsalis pedis pulses are 2+ on the right side and 2+ on the left side.       Posterior tibial pulses are 2+ on the right side and 2+ on the left side.     Heart sounds: Normal heart sounds.  Pulmonary:     Effort: Pulmonary effort is normal. No respiratory distress.  Breath sounds: Normal breath sounds and air entry.  Chest:     Chest wall: No tenderness.  Abdominal:     General: Bowel sounds are normal.     Palpations: Abdomen is soft.     Tenderness: There is no abdominal tenderness. There is no guarding or rebound.  Musculoskeletal: Normal range of motion.     Right lower leg: Normal.     Left lower leg: Normal.       Legs:     Comments: Minimal swelling of the left lateral hip and lateral upper thigh, no discoloration is present.  No erythema or increased warmth.  Patient states that the swelling has resolved since lying down here in the emergency department.  Well-healing surgical scar present to the lumbar spine, there is no active drainage or palpable fluctuance in this area, Steri-Strips present.  Mild erythema of the lower back appears to be improving since patient has been laying on his side versus laying on his back.  Pedal pulses, sensation, capillary refill and movement intact to bilateral lower extremities.  Feet:     Right foot:     Protective Sensation: 3 sites tested. 3 sites sensed.     Left foot:     Protective Sensation: 3 sites tested. 3 sites sensed.  Skin:    General: Skin is warm and dry.  Neurological:     Mental Status: He is alert and oriented to person, place, and time.     GCS: GCS eye subscore is 4.  GCS verbal subscore is 5. GCS motor subscore is 6.     Comments: Speech is clear and goal oriented, follows commands Major Cranial nerves without deficit, no facial droop Normal strength in upper and lower extremities bilaterally including dorsiflexion and plantar flexion, strong and equal grip strength Sensation normal to light touch Moves extremities without ataxia, coordination intact  Psychiatric:        Behavior: Behavior normal.    ED Treatments / Results  Labs (all labs ordered are listed, but only abnormal results are displayed) Labs Reviewed  CBC WITH DIFFERENTIAL/PLATELET - Abnormal; Notable for the following components:      Result Value   Monocytes Absolute 1.2 (*)    All other components within normal limits  BASIC METABOLIC PANEL - Abnormal; Notable for the following components:   Glucose, Bld 107 (*)    Creatinine, Ser 1.29 (*)    Calcium 8.2 (*)    GFR calc non Af Amer 56 (*)    All other components within normal limits  PROTIME-INR    EKG EKG Interpretation  Date/Time:  Sunday March 26 2018 19:09:49 EST Ventricular Rate:  86 PR Interval:    QRS Duration: 77 QT Interval:  351 QTC Calculation: 420 R Axis:   58 Text Interpretation:  Sinus rhythm Borderline T wave abnormalities Baseline wander in lead(s) V3 Abnormal ekg Confirmed by Carmin Muskrat [phone]) on 03/26/2018 7:49:03 PM   Radiology Ct Abdomen Pelvis W Contrast  Result Date: 03/26/2018 CLINICAL DATA:  Spinal surgery 4 days ago. Left hip swelling, cellulitis, and skin lesion. EXAM: CT ABDOMEN AND PELVIS WITH CONTRAST TECHNIQUE: Multidetector CT imaging of the abdomen and pelvis was performed using the standard protocol following bolus administration of intravenous contrast. CONTRAST:  165mL OMNIPAQUE IOHEXOL 300 MG/ML  SOLN COMPARISON:  None. FINDINGS: Lower chest: Lung bases are clear. Small esophageal hiatal hernia. Hepatobiliary: No focal liver abnormality is seen. No gallstones, gallbladder wall  thickening, or biliary dilatation. Pancreas: Unremarkable. No  pancreatic ductal dilatation or surrounding inflammatory changes. Spleen: Normal in size without focal abnormality. Adrenals/Urinary Tract: Adrenal glands are unremarkable. Kidneys are normal, without renal calculi, focal lesion, or hydronephrosis. Bladder is unremarkable. Stomach/Bowel: Stomach is within normal limits. Appendix is not identified. No evidence of bowel wall thickening, distention, or inflammatory changes. Vascular/Lymphatic: Mild aortic calcification. No aneurysm. No significant lymphadenopathy. Reproductive: Prostate is unremarkable. Other: No free air or free fluid in the abdomen. Subcutaneous soft tissue gas and fluid posterior to the lower lumbar spine likely postoperative period infiltration in the subcutaneous fat lateral to the left hip may indicate edema or cellulitis. No loculated collection to suggest an abscess. Musculoskeletal: Postoperative changes with posterior fixation and intervertebral prosthesis of L4-5 interspace. Degenerative changes in the lumbar spine. Sacrum, pelvis, and hips appear intact. No cortical erosion or bone destruction to suggest osteomyelitis. IMPRESSION: 1. No acute process demonstrated in the abdomen or pelvis. No evidence of bowel obstruction or inflammation. 2. Postoperative changes with posterior fixation and intervertebral prosthesis of L4-5 interspace. Gas and fluid in the overlying soft tissues consistent with recent postoperative change. 3. Subcutaneous soft tissue infiltration over the left hip may indicate edema or cellulitis. No abscess or osteomyelitis. Electronically Signed   By: Lucienne Capers M.D.   On: 03/26/2018 21:12    Procedures Procedures (including critical care time)  Medications Ordered in ED Medications  sodium chloride 0.9 % bolus 500 mL (0 mLs Intravenous Stopped 03/26/18 2200)  iohexol (OMNIPAQUE) 300 MG/ML solution 100 mL (100 mLs Intravenous Contrast Given 03/26/18  2021)  oxyCODONE (Oxy IR/ROXICODONE) immediate release tablet 5 mg (5 mg Oral Given 03/26/18 2157)  doxycycline (VIBRA-TABS) tablet 100 mg (100 mg Oral Given 03/26/18 2224)     Initial Impression / Assessment and Plan / ED Course  I have reviewed the triage vital signs and the nursing notes.  Pertinent labs & imaging results that were available during my care of the patient were reviewed by me and considered in my medical decision making (see chart for details).  Clinical Course as of Mar 26 2304  Nancy Fetter Mar 26, 2018  2155 Discussed case with Neurosurgery, [place]; recommends patient be seen tomorrow morning in their office for follow-up.   [BM]    Clinical Course User Index [BM] Deliah Boston, PA-C   73 year old male presenting for left hip and left upper thighs swelling, 4 days post lumbar surgery.  Physical examination reveals minimal swelling to left lateral hip appears to be resolving.  No overt signs of infection are present, incision site is well-appearing.  No neuro deficits on examination and no history concerning for infection or cauda equina.  Distribution of patient's swelling is not consistent with DVT.  Patient has restarted his Eliquis yesterday morning.  Work-up begun, patient seen and evaluated by Dr. Vanita Panda who agrees with basic blood work and CT scan.  CBC nonacute BMP nonacute PT/INR within normal limits CT: IMPRESSION: 1. No acute process demonstrated in the abdomen or pelvis. No evidence of bowel obstruction or inflammation. 2. Postoperative changes with posterior fixation and intervertebral prosthesis of L4-5 interspace. Gas and fluid in the overlying soft tissues consistent with recent postoperative change. 3. Subcutaneous soft tissue infiltration over the left hip may indicate edema or cellulitis. No abscess or osteomyelitis.  Discussed case with neurosurgery, Margo Aye NP.  Neurosurgery advises that patient call their office tomorrow morning to schedule to be  seen there tomorrow. ------------- Case rediscussed with Dr. Vanita Panda.  There are no overt signs of  infection on physical examination today and blood work is reassuring however plan at this time is to proceed with caution and start patient on doxycycline due to possibility of early cellulitis in this post surgical patient.  Patient is to follow-up with neurosurgeon tomorrow morning for further evaluation.  Low suspicion for DVT or acute etiology of patient's transient swelling at this time based on history and physical examination today.  Additionally patient is already on Eliquis started yesterday.  Patient reports that since arrival to emergency department he is feeling improved and swelling has improved.  He denies chest pain or shortness of breath.  Patient is afebrile, not tachycardic, not hypotensive with SPO2 of 100% on room air.  At this time there does not appear to be any evidence of an acute emergency medical condition and the patient appears stable for discharge with appropriate outpatient follow up. Diagnosis was discussed with patient who verbalizes understanding of care plan and is agreeable to discharge. I have discussed return precautions with patient and wife who verbalize understanding of return precautions. Patient strongly encouraged to follow-up with their neurosurgery tomorrow. All questions answered.  Patient's case rediscussed with Dr. Vanita Panda who agrees with plan to discharge with doxycycline and follow-up.   Note: Portions of this report may have been transcribed using voice recognition software. Every effort was made to ensure accuracy; however, inadvertent computerized transcription errors may still be present. Final Clinical Impressions(s) / ED Diagnoses   Final diagnoses:  Soft tissue swelling    ED Discharge Orders         Ordered    doxycycline (VIBRAMYCIN) 100 MG capsule  2 times daily     03/26/18 2245           Gari Crown 03/26/18 2312     Carmin Muskrat, MD 03/26/18 2316

## 2018-03-26 NOTE — ED Triage Notes (Signed)
Pt reports he had a 4 hour spinal surgery on WED. Pt reports progress was normal until today . Pt reports Lt upper thigh swelling and pain . PT restarted eliquis on SAT. PT also reports he a Hx DVT with factor -5.

## 2018-04-14 ENCOUNTER — Other Ambulatory Visit: Payer: Self-pay | Admitting: Internal Medicine

## 2018-04-14 DIAGNOSIS — E785 Hyperlipidemia, unspecified: Secondary | ICD-10-CM

## 2018-04-17 ENCOUNTER — Ambulatory Visit
Admission: RE | Admit: 2018-04-17 | Discharge: 2018-04-17 | Disposition: A | Discharge status: Home / Self Care | Payer: 59 | Source: Ambulatory Visit | Attending: Internal Medicine | Admitting: Internal Medicine

## 2018-04-17 DIAGNOSIS — E785 Hyperlipidemia, unspecified: Secondary | ICD-10-CM

## 2018-05-17 ENCOUNTER — Other Ambulatory Visit: Payer: Self-pay | Admitting: Internal Medicine

## 2018-05-18 ENCOUNTER — Other Ambulatory Visit: Payer: Self-pay | Admitting: Internal Medicine

## 2018-05-18 NOTE — Telephone Encounter (Signed)
Has he switched PCPs? If so should go to new PCP

## 2020-07-23 IMAGING — CT CT ABD-PELV W/ CM
2 of 5 series · 15 of 46 positions shown, 17 images · IV contrast (Omni 300)
Comparison: None.

CLINICAL DATA: Spinal surgery 4 days ago. Left hip swelling,
cellulitis, and skin lesion.

EXAM:
CT ABDOMEN AND PELVIS WITH CONTRAST
TECHNIQUE: Multidetector CT imaging of the abdomen and pelvis was performed
using the standard protocol following bolus administration of
intravenous contrast.
CONTRAST:  100mL OMNIPAQUE IOHEXOL 300 MG/ML  SOLN

[Series 4: a/p w/ 5mm · axial · 0.89mm/px · z∈[+630,+1220]mm · 12 of 132 slices shown, 14 images]
[im 7/132  soft-tissue]
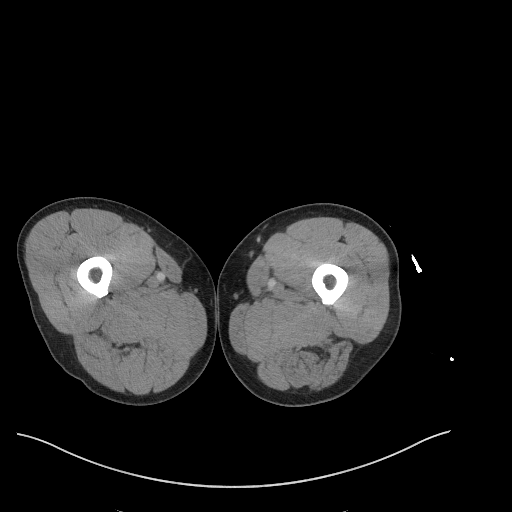
[im 7/132  bone]
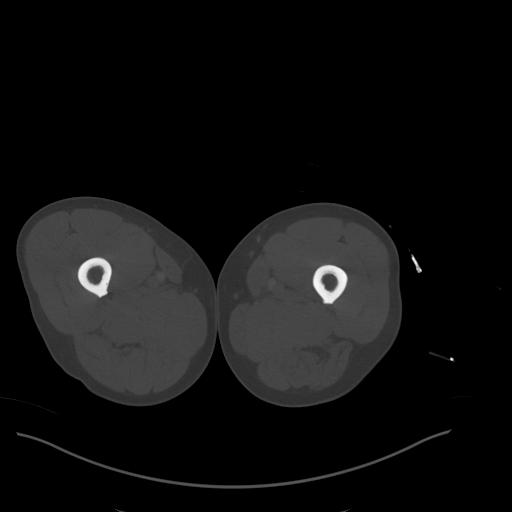
[im 19/132  soft-tissue]
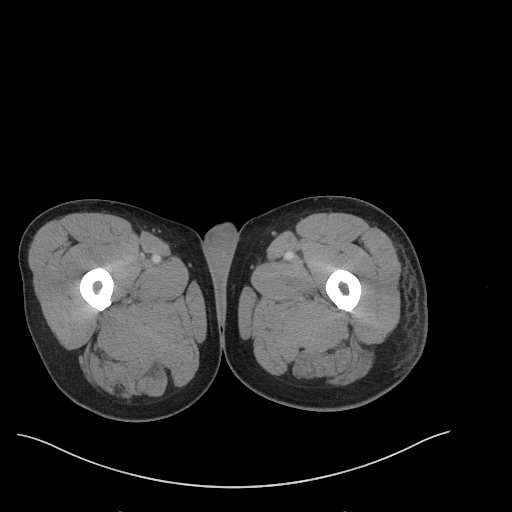
[im 32/132  soft-tissue]
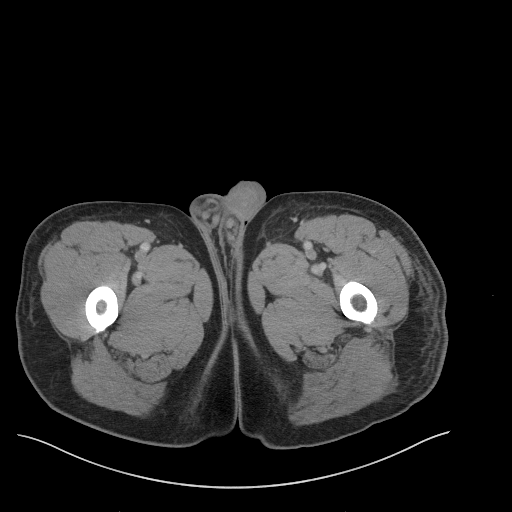
[im 38/132  soft-tissue]
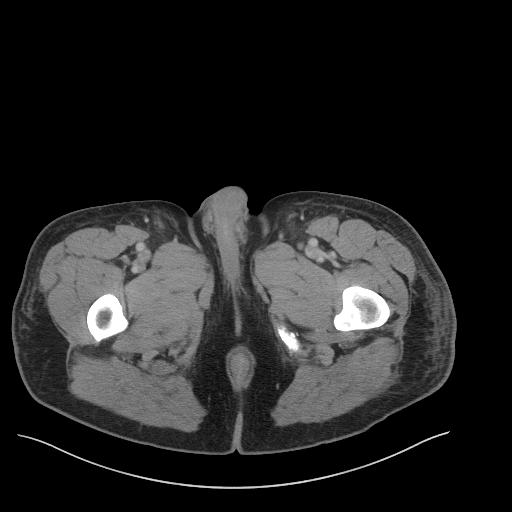
[im 50/132  soft-tissue]
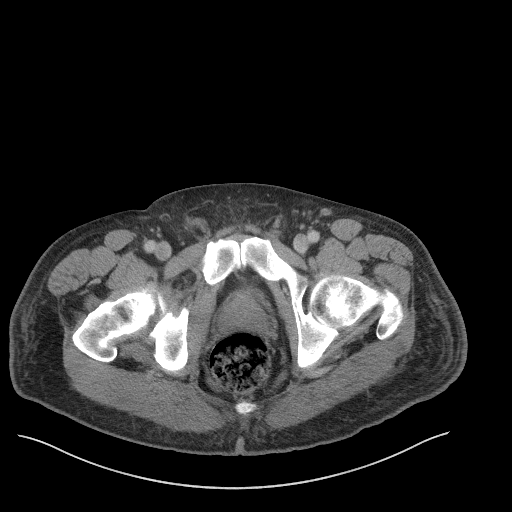
[im 63/132  soft-tissue]
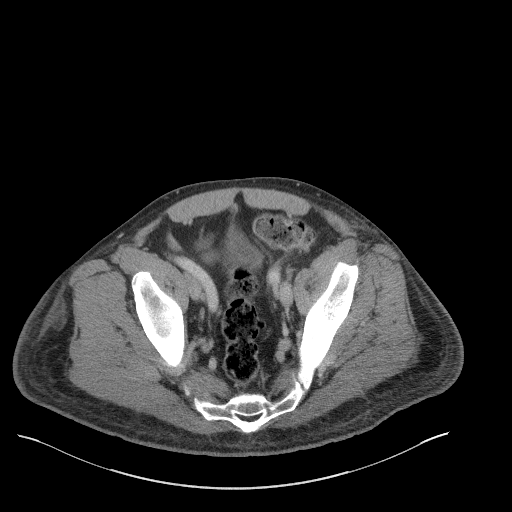
[im 69/132  soft-tissue]
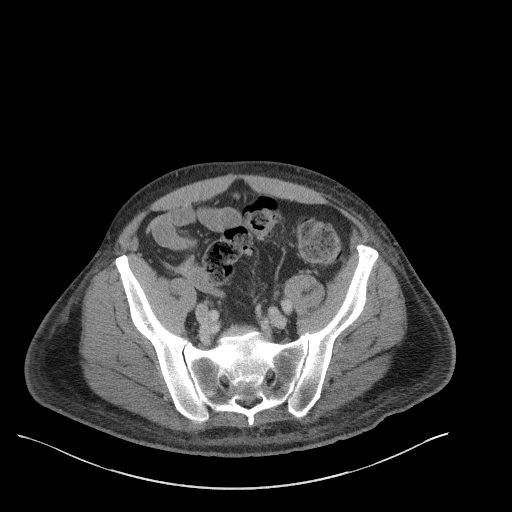
[im 82/132  soft-tissue]
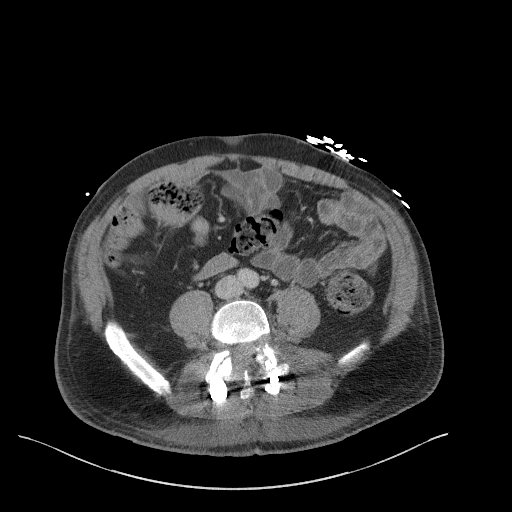
[im 94/132  soft-tissue]
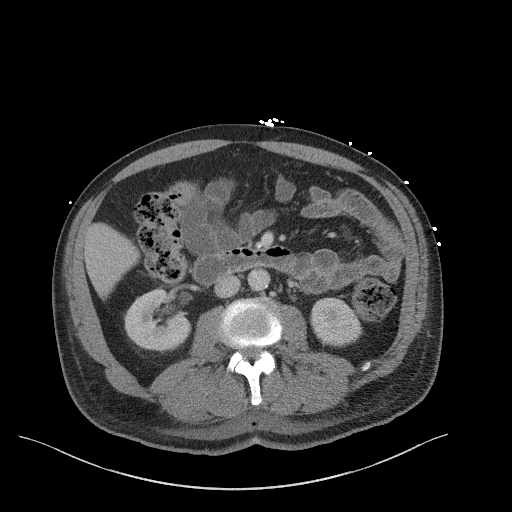
[im 94/132  bone]
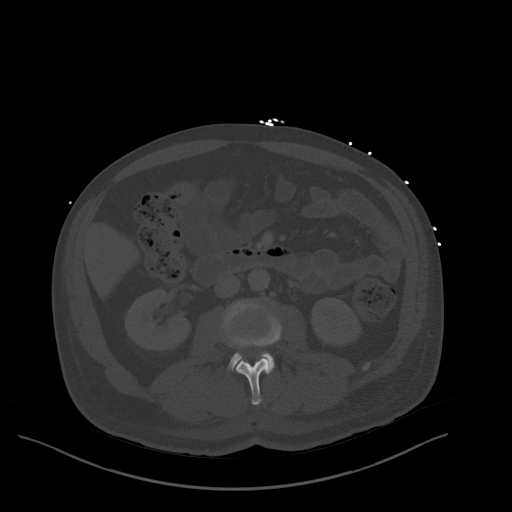
[im 100/132  soft-tissue]
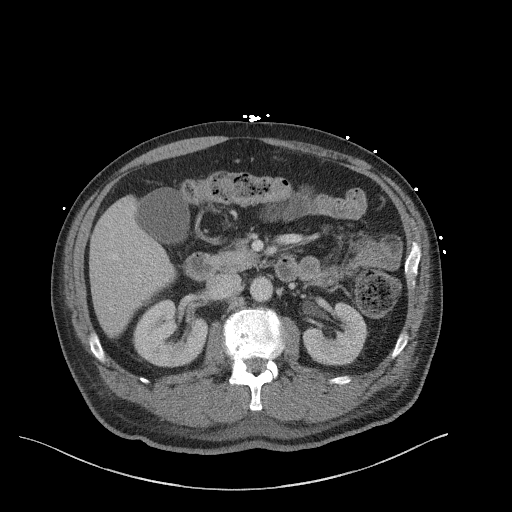
[im 113/132  soft-tissue]
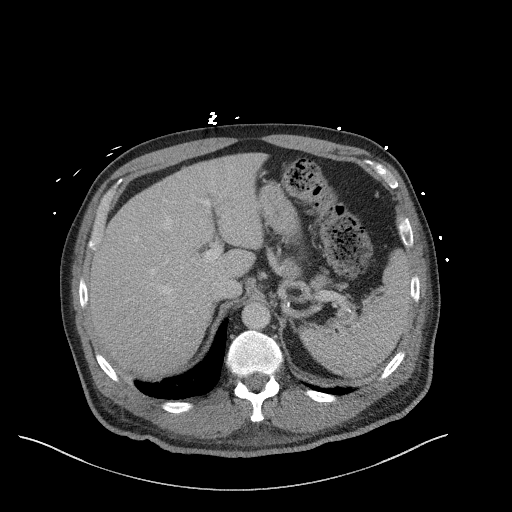
[im 125/132  soft-tissue]
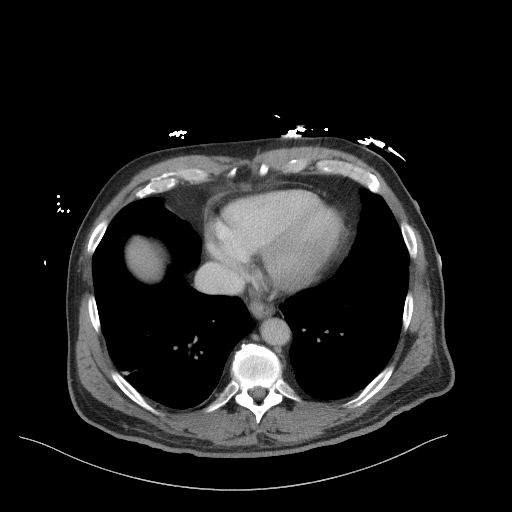

[Series 9: a/p w/ cor2 · coronal · 0.82mm/px · 3 of 148 slices shown]
[im 50/148  soft-tissue]
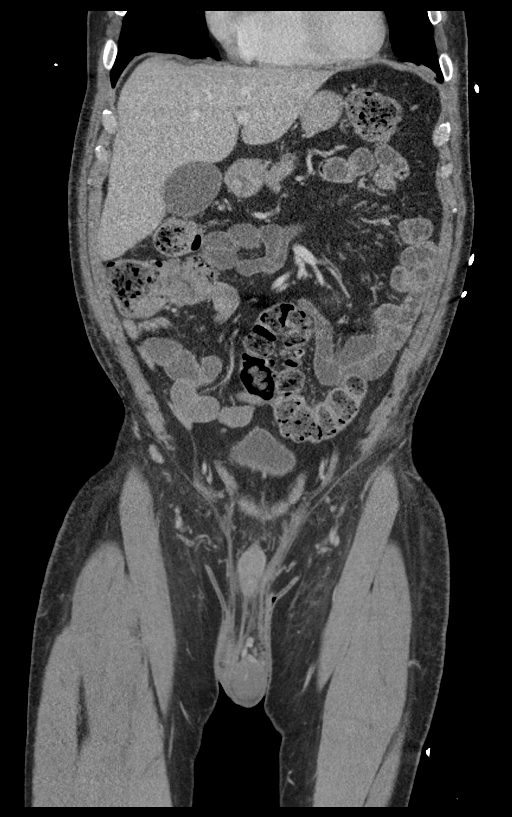
[im 66/148  soft-tissue]
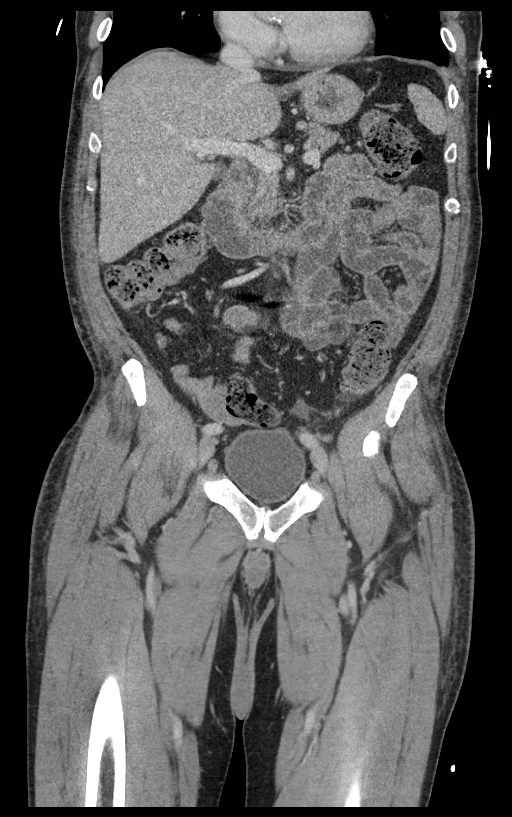
[im 82/148  soft-tissue]
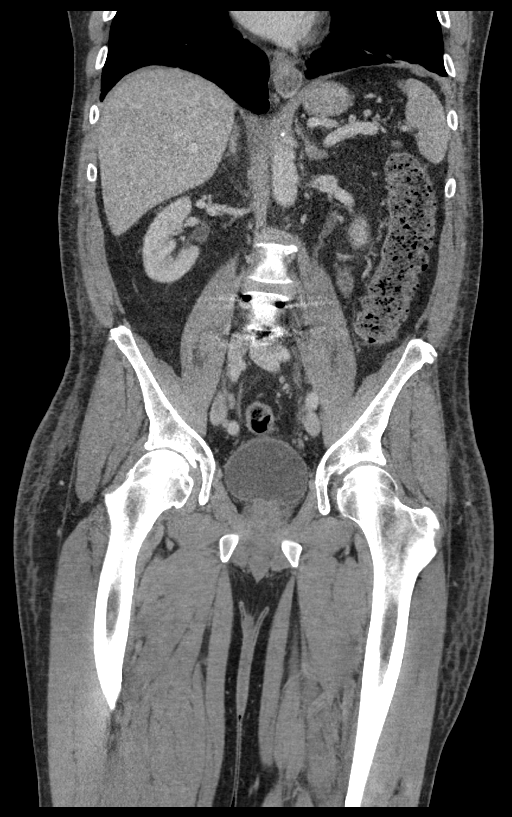

[15 of 46 positions shown; findings below may reference images not displayed]

FINDINGS: Lower chest: Lung bases are clear. Small esophageal hiatal hernia.

Hepatobiliary: No focal liver abnormality is seen. No gallstones,
gallbladder wall thickening, or biliary dilatation.

Pancreas: Unremarkable. No pancreatic ductal dilatation or
surrounding inflammatory changes.

Spleen: Normal in size without focal abnormality.

Adrenals/Urinary Tract: Adrenal glands are unremarkable. Kidneys are
normal, without renal calculi, focal lesion, or hydronephrosis.
Bladder is unremarkable.

Stomach/Bowel: Stomach is within normal limits. Appendix is not
identified. No evidence of bowel wall thickening, distention, or
inflammatory changes.

Vascular/Lymphatic: Mild aortic calcification. No aneurysm. No
significant lymphadenopathy.

Reproductive: Prostate is unremarkable.

Other: No free air or free fluid in the abdomen. Subcutaneous soft
tissue gas and fluid posterior to the lower lumbar spine likely
postoperative period infiltration in the subcutaneous fat lateral to
the left hip may indicate edema or cellulitis. No loculated
collection to suggest an abscess.

Musculoskeletal: Postoperative changes with posterior fixation and
intervertebral prosthesis of L4-5 interspace. Degenerative changes
in the lumbar spine. Sacrum, pelvis, and hips appear intact. No
cortical erosion or bone destruction to suggest osteomyelitis.
IMPRESSION: 1. No acute process demonstrated in the abdomen or pelvis. No
evidence of bowel obstruction or inflammation.
2. Postoperative changes with posterior fixation and intervertebral
prosthesis of L4-5 interspace. Gas and fluid in the overlying soft
tissues consistent with recent postoperative change.
3. Subcutaneous soft tissue infiltration over the left hip may
indicate edema or cellulitis. No abscess or osteomyelitis.

## 2020-10-28 DIAGNOSIS — Z6828 Body mass index (BMI) 28.0-28.9, adult: Secondary | ICD-10-CM | POA: Diagnosis not present

## 2020-10-28 DIAGNOSIS — M4316 Spondylolisthesis, lumbar region: Secondary | ICD-10-CM | POA: Diagnosis not present

## 2020-10-28 DIAGNOSIS — R030 Elevated blood-pressure reading, without diagnosis of hypertension: Secondary | ICD-10-CM | POA: Diagnosis not present

## 2020-11-25 ENCOUNTER — Encounter: Payer: Self-pay | Admitting: Internal Medicine

## 2020-11-26 DIAGNOSIS — H02831 Dermatochalasis of right upper eyelid: Secondary | ICD-10-CM | POA: Diagnosis not present

## 2020-11-26 DIAGNOSIS — H02834 Dermatochalasis of left upper eyelid: Secondary | ICD-10-CM | POA: Diagnosis not present

## 2020-11-26 DIAGNOSIS — H2513 Age-related nuclear cataract, bilateral: Secondary | ICD-10-CM | POA: Diagnosis not present

## 2020-11-26 DIAGNOSIS — H11443 Conjunctival cysts, bilateral: Secondary | ICD-10-CM | POA: Diagnosis not present

## 2020-11-26 DIAGNOSIS — H04123 Dry eye syndrome of bilateral lacrimal glands: Secondary | ICD-10-CM | POA: Diagnosis not present

## 2020-11-26 DIAGNOSIS — H10413 Chronic giant papillary conjunctivitis, bilateral: Secondary | ICD-10-CM | POA: Diagnosis not present

## 2020-11-26 DIAGNOSIS — H11121 Conjunctival concretions, right eye: Secondary | ICD-10-CM | POA: Diagnosis not present

## 2021-01-10 DIAGNOSIS — Z23 Encounter for immunization: Secondary | ICD-10-CM | POA: Diagnosis not present

## 2021-01-22 ENCOUNTER — Ambulatory Visit (INDEPENDENT_AMBULATORY_CARE_PROVIDER_SITE_OTHER): Payer: PPO | Admitting: Physician Assistant

## 2021-01-22 ENCOUNTER — Encounter: Payer: Self-pay | Admitting: Physician Assistant

## 2021-01-22 VITALS — BP 120/62 | HR 77 | Ht 71.0 in | Wt 204.0 lb

## 2021-01-22 DIAGNOSIS — D6851 Activated protein C resistance: Secondary | ICD-10-CM

## 2021-01-22 DIAGNOSIS — Z86718 Personal history of other venous thrombosis and embolism: Secondary | ICD-10-CM

## 2021-01-22 DIAGNOSIS — Z86010 Personal history of colonic polyps: Secondary | ICD-10-CM | POA: Diagnosis not present

## 2021-01-22 DIAGNOSIS — Z7901 Long term (current) use of anticoagulants: Secondary | ICD-10-CM | POA: Diagnosis not present

## 2021-01-22 MED ORDER — NA SULFATE-K SULFATE-MG SULF 17.5-3.13-1.6 GM/177ML PO SOLN: 1.0000 | Freq: Once | ORAL | 0 refills | Status: AC

## 2021-01-22 NOTE — Progress Notes (Signed)
Chief Complaint: Consult for colonoscopy on chronic anticoagulation  HPI:    Steven Hartman is a 75 year old Caucasian male with a past medical history as listed below including factor V Leiden and DVT in 2019 on Eliquis, known to Dr. Carlean Purl, who presents to clinic today for consultation of a colonoscopy on chronic anticoagulation.    08/06/2015 colonoscopy done for a personal history of adenomatous polyps on a colonoscopy more than 5 years prior, with Dr. Carlean Purl with two 4-5 mm polyps in the transverse colon and ascending colon, diverticulosis in the sigmoid colon and otherwise normal.  Pathology showed adenomatous polyps and repeat was recommended 07/2020.    Today, the patient tells me he is doing well.  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  Denies any GI issues at all.    Denies fever, chills, weight loss, blood in his stool, change in bowel habits or abdominal pain.  Past Medical History:  Diagnosis Date   Factor 5 Leiden mutation, heterozygous ([name])    GERD (gastroesophageal reflux disease)    History of chicken pox    Hypertension    Sciatic pain    Synovial cyst of lumbar spine     Past Surgical History:  Procedure Laterality Date   COLONOSCOPY  multiple   TONSILLECTOMY AND ADENOIDECTOMY  1973    Current Outpatient Medications  Medication Sig Dispense Refill   apixaban (ELIQUIS) 5 MG TABS tablet Take 1 tablet (5 mg total) by mouth 2 (two) times daily. 180 tablet 1   fexofenadine (ALLEGRA) 180 MG tablet Take 180 mg by mouth daily as needed for allergies or rhinitis.     MICARDIS 80 MG tablet TAKE 1 TABLET BY MOUTH DAILY 90 tablet 0   Multiple Vitamins-Minerals (MULTIVITAMIN PO) Take 1 tablet by mouth daily.     oxyCODONE (OXY IR/ROXICODONE) 5 MG immediate release tablet Take 1 tablet (5 mg total) by mouth every 4 (four) hours as needed for moderate pain ((score 4  to 6)). 30 tablet 0   sildenafil (REVATIO) 20 MG tablet Take 4 tablets (80 mg total) by mouth daily as needed. (Patient taking differently: Take 80 mg by mouth daily as needed (for ED). ) 60 tablet 11   triamcinolone (NASACORT ALLERGY 24HR) 55 MCG/ACT AERO nasal inhaler Place 2 sprays into the nose daily as needed (for allergies).      No current facility-administered medications for this visit.    Allergies as of 01/22/2021 - Review Complete 03/26/2018  Allergen Reaction Noted   Gelatin Other (See Comments) 03/21/2018   Meat [alpha-gal] Hives, Rash, and Other (See Comments) 03/13/2018    Family History  Problem Relation Age of Onset   Arthritis Father    Parkinson's disease Mother 63   Prostate cancer Brother    Colon cancer Neg Hx     Social History   Socioeconomic History   Marital status: Married    Spouse name: Not on file   Number of children: Not on file   Years of education: Not on file   Highest education level: Not on file  Occupational History   Not on file  Tobacco Use   Smoking status: Never   Smokeless tobacco: Never  Vaping Use   Vaping Use: Never used  Substance and Sexual Activity   Alcohol use: Yes    Alcohol/week: 5.0 standard drinks    Types: 5 Standard drinks  or equivalent per week    Comment: Wine   Drug use: No   Sexual activity: Yes    Partners: Female  Other Topics Concern   Not on file  Social History Narrative   6-7 hours of sleep a night   3 people living in the home    Drinks caffienated beverages    Exercise at least 3 times a week   Takes vitamins   Employed   Occupation - Administrator - masters    Married      Enjoys Warden/ranger.    Social Determinants of Health   Financial Resource Strain: Not on file  Food Insecurity: Not on file  Transportation Needs: Not on file  Physical Activity: Not on file  Stress: Not on file  Social Connections: Not on file  Intimate Partner Violence: Not on file     Review of Systems:    Constitutional: No weight loss, fever or chills Skin: No rash  Cardiovascular: No chest pain Respiratory: No SOB Gastrointestinal: See HPI and otherwise negative Genitourinary: No dysuria  Neurological: No headache, dizziness or syncope Musculoskeletal: No new muscle or joint pain Hematologic: No bleeding  Psychiatric: No history of depression or anxiety   Physical Exam:  Vital signs: BP 120/62   Pulse 77   Ht 5\' 11"  (1.803 m)   Wt 204 lb (92.5 kg)   SpO2 93%   BMI 28.45 kg/m    Constitutional:   Pleasant Caucasian male appears to be in NAD, Well developed, Well nourished, alert and cooperative Head:  Normocephalic and atraumatic. Eyes:   PEERL, EOMI. No icterus. Conjunctiva pink. Ears:  Normal auditory acuity. Neck:  Supple Throat: Oral cavity and pharynx without inflammation, swelling or lesion.  Respiratory: Respirations even and unlabored. Lungs clear to auscultation bilaterally.   No wheezes, crackles, or rhonchi.  Cardiovascular: Normal S1, S2. No MRG. Regular rate and rhythm. No peripheral edema, cyanosis or pallor.  Gastrointestinal:  Soft, nondistended, nontender. No rebound or guarding. Normal bowel sounds. No appreciable masses or hepatomegaly. Rectal:  Not performed.  Msk:  Symmetrical without gross deformities. Without edema, no deformity or joint abnormality.  Neurologic:  Alert and  oriented x4;  grossly normal neurologically.  Skin:   Dry and intact without significant lesions or rashes. Psychiatric: Demonstrates good judgement and reason without abnormal affect or behaviors.  No recent labs.   Assessment: 1.  History of adenomatous polyps: Last colonoscopy in 2017 with recommendations to repeat in 5 years 2.  Factor V Leiden with history of DVT in 2019: Maintained on Eliquis  Plan: 1.  Scheduled patient for a surveillance colonoscopy in the [place] with Dr. Carlean Purl.  Did provide the patient a detailed list of risks for the  procedure and he agrees to proceed. Patient is appropriate for endoscopic procedure(s) in the ambulatory ([place]) setting.  2.  Advised patient to hold his Eliquis for 2 days prior to time of procedure.  We will communicate with his prescribing physician Dr. Joylene Draft to ensure this is acceptable for him. 3.  Patient follow in clinic per recommendations from Dr. Carlean Purl after time of procedure.  Ellouise Newer, PA-C [facility] 01/22/2021, 9:37 AM  Cc: Crist Infante, MD

## 2021-01-22 NOTE — Patient Instructions (Signed)
You have been scheduled for a colonoscopy. Please follow written instructions given to you at your visit today.  Please pick up your prep supplies at the pharmacy within the next 1-3 days. If you use inhalers (even only as needed), please bring them with you on the day of your procedure.  If you are age 73 or older, your body mass index should be between 23-30. Your Body mass index is 28.45 kg/m. If this is out of the aforementioned range listed, please consider follow up with your Primary Care Provider.  If you are age 56 or younger, your body mass index should be between 19-25. Your Body mass index is 28.45 kg/m. If this is out of the aformentioned range listed, please consider follow up with your Primary Care Provider.   ________________________________________________________  The [facility] providers would like to encourage you to use [facility] to communicate with providers for non-urgent requests or questions.  Due to long hold times on the telephone, sending your provider a message by [facility] may be a faster and more efficient way to get a response.  Please allow 48 business hours for a response.  Please remember that this is for non-urgent requests.  _______________________________________________________

## 2021-01-23 ENCOUNTER — Telehealth: Payer: Self-pay | Admitting: *Deleted

## 2021-01-23 NOTE — Telephone Encounter (Signed)
   Steven Hartman [birth year] [id number]  Dear Dr. Joylene Draft:  We have scheduled the above named patient for a(n) colonoscopy procedure. Our records show that (s)he is on anticoagulation therapy.  Please advise as to whether the patient may come off their therapy of Eliquis 2 days prior to their procedure which is scheduled for 02/26/21.  Please route your response to Caryl Asp, [place] or fax response to [phone].  Sincerely,   Caryl Asp, [facility]

## 2021-01-27 NOTE — Telephone Encounter (Signed)
Faxed letter to Dr. Joylene Draft office.

## 2021-02-02 NOTE — Telephone Encounter (Signed)
Spoke with Caryl Pina, RN and she provided an update fax number to refax Eliquis hold.

## 2021-02-02 NOTE — Telephone Encounter (Signed)
Left message with Caryl Pina, RN to call back with an update on clearance.

## 2021-02-03 NOTE — Telephone Encounter (Signed)
Per Dr. Joylene Draft it is okay for patient to hold Eliquis for 2 days prior to procedure.

## 2021-02-03 NOTE — Telephone Encounter (Signed)
Patient informed to hold Eliquis. 

## 2021-02-23 DIAGNOSIS — I10 Essential (primary) hypertension: Secondary | ICD-10-CM | POA: Diagnosis not present

## 2021-02-23 DIAGNOSIS — Z125 Encounter for screening for malignant neoplasm of prostate: Secondary | ICD-10-CM | POA: Diagnosis not present

## 2021-02-23 DIAGNOSIS — M48061 Spinal stenosis, lumbar region without neurogenic claudication: Secondary | ICD-10-CM | POA: Diagnosis not present

## 2021-02-23 DIAGNOSIS — E785 Hyperlipidemia, unspecified: Secondary | ICD-10-CM | POA: Diagnosis not present

## 2021-02-26 ENCOUNTER — Encounter: Payer: Self-pay | Admitting: Internal Medicine

## 2021-02-26 ENCOUNTER — Ambulatory Visit (AMBULATORY_SURGERY_CENTER): Payer: PPO | Admitting: Internal Medicine

## 2021-02-26 ENCOUNTER — Other Ambulatory Visit: Payer: Self-pay

## 2021-02-26 VITALS — BP 124/68 | HR 61 | Temp 98.6°F | Resp 15 | Ht 71.0 in | Wt 204.0 lb

## 2021-02-26 DIAGNOSIS — Z86010 Personal history of colonic polyps: Secondary | ICD-10-CM | POA: Diagnosis not present

## 2021-02-26 DIAGNOSIS — D122 Benign neoplasm of ascending colon: Secondary | ICD-10-CM

## 2021-02-26 DIAGNOSIS — D120 Benign neoplasm of cecum: Secondary | ICD-10-CM

## 2021-02-26 MED ORDER — SODIUM CHLORIDE 0.9 % IV SOLN: 500.0000 mL | Freq: Once | INTRAVENOUS | Status: DC

## 2021-02-26 NOTE — Patient Instructions (Addendum)
I removed 3 very tiny polyps today. I will let you know pathology results and if or  when to have another routine colonoscopy by mail and/or My Chart.  I appreciate the opportunity to care for you. Gatha Mayer, MD, [facility]   Handouts on diverticulosis and polyps given to patient. Await pathology results. Resume Eliquis at prior dose tomorrow 02/27/21   YOU HAD AN ENDOSCOPIC PROCEDURE TODAY AT [place]:   Refer to the procedure report that was given to you for any specific questions about what was found during the examination.  If the procedure report does not answer your questions, please call your gastroenterologist to clarify.  If you requested that your care partner not be given the details of your procedure findings, then the procedure report has been included in a sealed envelope for you to review at your convenience later.  YOU SHOULD EXPECT: Some feelings of bloating in the abdomen. Passage of more gas than usual.  Walking can help get rid of the air that was put into your GI tract during the procedure and reduce the bloating. If you had a lower endoscopy (such as a colonoscopy or flexible sigmoidoscopy) you may notice spotting of blood in your stool or on the toilet paper. If you underwent a bowel prep for your procedure, you may not have a normal bowel movement for a few days.  Please Note:  You might notice some irritation and congestion in your nose or some drainage.  This is from the oxygen used during your procedure.  There is no need for concern and it should clear up in a day or so.  SYMPTOMS TO REPORT IMMEDIATELY:  Following lower endoscopy (colonoscopy or flexible sigmoidoscopy):  Excessive amounts of blood in the stool  Significant tenderness or worsening of abdominal pains  Swelling of the abdomen that is new, acute  Fever of 100F or higher  For urgent or emergent issues, a gastroenterologist can be reached at any hour by calling [phone]. Do  not use MyChart messaging for urgent concerns.    DIET:  We do recommend a small meal at first, but then you may proceed to your regular diet.  Drink plenty of fluids but you should avoid alcoholic beverages for 24 hours.  ACTIVITY:  You should plan to take it easy for the rest of today and you should NOT DRIVE or use heavy machinery until tomorrow (because of the sedation medicines used during the test).    FOLLOW UP: Our staff will call the number listed on your records 48-72 hours following your procedure to check on you and address any questions or concerns that you may have regarding the information given to you following your procedure. If we do not reach you, we will leave a message.  We will attempt to reach you two times.  During this call, we will ask if you have developed any symptoms of COVID 19. If you develop any symptoms (ie: fever, flu-like symptoms, shortness of breath, cough etc.) before then, please call [phone].  If you test positive for Covid 19 in the 2 weeks post procedure, please call and report this information to [place].    If any biopsies were taken you will be contacted by phone or by letter within the next 1-3 weeks.  Please call us at [phone] if you have not heard about the biopsies in 3 weeks.    SIGNATURES/CONFIDENTIALITY: You and/or your care partner have signed paperwork which will be  entered into your electronic medical record.  These signatures attest to the fact that that the information above on your After Visit Summary has been reviewed and is understood.  Full responsibility of the confidentiality of this discharge information lies with you and/or your care-partner.

## 2021-02-26 NOTE — Progress Notes (Signed)
Sedate, gd SR, tolerated procedure well, VSS, report to RN 

## 2021-02-26 NOTE — Progress Notes (Signed)
[facility] History and Physical   Primary Care Physician:  Crist Infante, MD   Reason for Procedure:   Hx colon polyps  Plan:    colonoscopy     HPI: Steven Hartman is a 75 y.o. male w/ hx colon polyps as below 2011 - 2 diminutive adenomas 08/06/2015 2 diminutive adenomas   Past Medical History:  Diagnosis Date   Factor 5 Leiden mutation, heterozygous ([place])    GERD (gastroesophageal reflux disease)    History of chicken pox    History of colon polyps    Hypertension    Sciatic pain    Synovial cyst of lumbar spine     Past Surgical History:  Procedure Laterality Date   BACK SURGERY     COLONOSCOPY  multiple   TONSILLECTOMY AND ADENOIDECTOMY  03/16/1971    Prior to Admission medications   Medication Sig Start Date End Date Taking? Authorizing Provider  apixaban (ELIQUIS) 2.5 MG TABS tablet Take 2.5 mg by mouth 2 (two) times daily.   Yes [provider]  fexofenadine (ALLEGRA) 180 MG tablet Take 180 mg by mouth daily as needed for allergies or rhinitis.   Yes [provider]  MICARDIS 80 MG tablet TAKE 1 TABLET BY MOUTH DAILY 03/06/18  Yes Hoyt Koch, MD  Multiple Vitamins-Minerals (MULTIVITAMIN PO) Take 1 tablet by mouth daily.   Yes [provider]  rosuvastatin (CRESTOR) 20 MG tablet Take 20 mg by mouth at bedtime. 11/24/20  Yes [provider]  sildenafil (REVATIO) 20 MG tablet Take 4 tablets (80 mg total) by mouth daily as needed. Patient taking differently: Take 80 mg by mouth daily as needed (for ED). 08/24/17  Yes Janith Lima, MD  triamcinolone (NASACORT) 55 MCG/ACT AERO nasal inhaler Place 2 sprays into the nose daily as needed (for allergies).    Yes [provider]  VITAMIN D, CHOLECALCIFEROL, PO Take 2,000 Units by mouth daily.   Yes [provider]  EPINEPHrine 0.3 mg/0.3 mL IJ SOAJ injection use as directed for severe allergic reaction Patient not taking: Reported on  02/26/2021 12/06/17   [provider]    Current Outpatient Medications  Medication Sig Dispense Refill   apixaban (ELIQUIS) 2.5 MG TABS tablet Take 2.5 mg by mouth 2 (two) times daily.     fexofenadine (ALLEGRA) 180 MG tablet Take 180 mg by mouth daily as needed for allergies or rhinitis.     MICARDIS 80 MG tablet TAKE 1 TABLET BY MOUTH DAILY 90 tablet 0   Multiple Vitamins-Minerals (MULTIVITAMIN PO) Take 1 tablet by mouth daily.     rosuvastatin (CRESTOR) 20 MG tablet Take 20 mg by mouth at bedtime.     sildenafil (REVATIO) 20 MG tablet Take 4 tablets (80 mg total) by mouth daily as needed. (Patient taking differently: Take 80 mg by mouth daily as needed (for ED).) 60 tablet 11   triamcinolone (NASACORT) 55 MCG/ACT AERO nasal inhaler Place 2 sprays into the nose daily as needed (for allergies).      VITAMIN D, CHOLECALCIFEROL, PO Take 2,000 Units by mouth daily.     EPINEPHrine 0.3 mg/0.3 mL IJ SOAJ injection use as directed for severe allergic reaction (Patient not taking: Reported on 02/26/2021)     Current Facility-Administered Medications  Medication Dose Route Frequency Provider Last Rate Last Admin   0.9 %  sodium chloride infusion  500 mL Intravenous Once Gatha Mayer, MD        Allergies as of  02/26/2021 - Review Complete 02/26/2021  Allergen Reaction Noted   Gelatin Other (See Comments) 03/21/2018   Meat [alpha-gal] Hives, Rash, and Other (See Comments) 03/13/2018    Family History  Problem Relation Age of Onset   Parkinson's disease Mother 64   Arthritis Father    Colon polyps Brother    Prostate cancer Brother    Colon cancer Neg Hx     Social History   Socioeconomic History   Marital status: Married    Spouse name: Not on file   Number of children: Not on file   Years of education: Not on file   Highest education level: Not on file  Occupational History   Not on file  Tobacco Use   Smoking status: Never   Smokeless tobacco: Never  Vaping Use    Vaping Use: Never used  Substance and Sexual Activity   Alcohol use: Yes    Alcohol/week: 5.0 standard drinks    Types: 5 Standard drinks or equivalent per week    Comment: Wine   Drug use: Never   Sexual activity: Yes    Partners: Female  Other Topics Concern   Not on file  Social History Narrative   6-7 hours of sleep a night   3 people living in the home    Drinks caffienated beverages    Exercise at least 3 times a week   Takes vitamins   Employed   Occupation - Administrator - masters    Married      Enjoys Warden/ranger.     Review of Systems:  All other review of systems negative except as mentioned in the HPI.  Physical Exam: Vital signs BP (!) 102/58    Pulse 68    Temp 98.6 F (37 C) (Temporal)    Ht 5\' 11"  (1.803 m)    Wt 204 lb (92.5 kg)    SpO2 97%    BMI 28.45 kg/m   General:   Alert,  Well-developed, well-nourished, pleasant and cooperative in NAD Lungs:  Clear throughout to auscultation.   Heart:  Regular rate and rhythm; no murmurs, clicks, rubs,  or gallops. Abdomen:  Soft, nontender and nondistended. Normal bowel sounds.   Neuro/Psych:  Alert and cooperative. Normal mood and affect. A and O x 3   @Christon Parada  Simonne Maffucci, MD, [facility] [phone] (pager) 02/26/2021 9:09 AM@

## 2021-02-26 NOTE — Progress Notes (Signed)
Called to room to assist during endoscopic procedure.  Patient ID and intended procedure confirmed with present staff. Received instructions for my participation in the procedure from the performing physician.  

## 2021-02-26 NOTE — Op Note (Signed)
[place] Patient Name: Steven Hartman Procedure Date: 02/26/2021 9:16 AM MRN: [id number] Endoscopist: Gatha Mayer , MD Age: 75 Referring MD:  Date of Birth: January 28, 1946 Gender: Male Account #: [id number] Procedure:                Colonoscopy Indications:              Surveillance: Personal history of adenomatous                            polyps on last colonoscopy 5 years ago Medicines:                Propofol per Anesthesia, Monitored Anesthesia Care Procedure:                Pre-Anesthesia Assessment:                           - Prior to the procedure, a History and Physical                            was performed, and patient medications and                            allergies were reviewed. The patient's tolerance of                            previous anesthesia was also reviewed. The risks                            and benefits of the procedure and the sedation                            options and risks were discussed with the patient.                            All questions were answered, and informed consent                            was obtained. Prior Anticoagulants: The patient                            last took Eliquis (apixaban) 2 days prior to the                            procedure. ASA Grade Assessment: II - A patient                            with mild systemic disease. After reviewing the                            risks and benefits, the patient was deemed in                            satisfactory condition to undergo the procedure.  After obtaining informed consent, the colonoscope                            was passed under direct vision. Throughout the                            procedure, the patient's blood pressure, pulse, and                            oxygen saturations were monitored continuously. The                            CF [id number] #[id number] was introduced through the anus                             and advanced to the the cecum, identified by                            appendiceal orifice and ileocecal valve. The                            colonoscopy was performed without difficulty. The                            patient tolerated the procedure well. The quality                            of the bowel preparation was good. The ileocecal                            valve, appendiceal orifice, and rectum were                            photographed. Scope In: 9:18:53 AM Scope Out: 9:38:50 AM Scope Withdrawal Time: 0 hours 17 minutes 26 seconds  Total Procedure Duration: 0 hours 19 minutes 57 seconds  Findings:                 The perianal and digital rectal examinations were                            normal. Pertinent negatives include normal prostate                            (size, shape, and consistency).                           Three sessile polyps were found in the ascending                            colon and cecum. The polyps were diminutive in                            size. These polyps were removed with a cold snare.  Resection and retrieval were complete. Verification                            of patient identification for the specimen was                            done. Estimated blood loss was minimal.                           Multiple small and large-mouthed diverticula were                            found in the sigmoid colon.                           The exam was otherwise without abnormality on                            direct and retroflexion views. Complications:            No immediate complications. Estimated Blood Loss:     Estimated blood loss was minimal. Impression:               - Three diminutive polyps in the ascending colon                            and in the cecum, removed with a cold snare.                            Resected and retrieved.                           - Diverticulosis in the sigmoid colon.                            - The examination was otherwise normal on direct                            and retroflexion views.                           - Personal history of colonic polyps. 2 diminutive                            adenomas in 2011 and 2017 Recommendation:           - Patient has a contact number available for                            emergencies. The signs and symptoms of potential                            delayed complications were discussed with the                            patient. Return to normal activities tomorrow.  Written discharge instructions were provided to the                            patient.                           - Resume previous diet.                           - Continue present medications.                           - Resume Eliquis (apixaban) at prior dose tomorrow.                           - Await pathology results.                           - No recommendation at this time regarding repeat                            colonoscopy due to age. Gatha Mayer, MD 02/26/2021 9:49:36 AM This report has been signed electronically.

## 2021-02-26 NOTE — Progress Notes (Signed)
Pt's states no medical or surgical changes since previsit or office visit.  VS CW  

## 2021-03-02 ENCOUNTER — Telehealth: Payer: Self-pay

## 2021-03-02 DIAGNOSIS — D6851 Activated protein C resistance: Secondary | ICD-10-CM | POA: Diagnosis not present

## 2021-03-02 DIAGNOSIS — Z0000 Encounter for general adult medical examination without abnormal findings: Secondary | ICD-10-CM | POA: Diagnosis not present

## 2021-03-02 DIAGNOSIS — Z1331 Encounter for screening for depression: Secondary | ICD-10-CM | POA: Diagnosis not present

## 2021-03-02 DIAGNOSIS — E785 Hyperlipidemia, unspecified: Secondary | ICD-10-CM | POA: Diagnosis not present

## 2021-03-02 DIAGNOSIS — I2510 Atherosclerotic heart disease of native coronary artery without angina pectoris: Secondary | ICD-10-CM | POA: Diagnosis not present

## 2021-03-02 DIAGNOSIS — G629 Polyneuropathy, unspecified: Secondary | ICD-10-CM | POA: Diagnosis not present

## 2021-03-02 DIAGNOSIS — I700 Atherosclerosis of aorta: Secondary | ICD-10-CM | POA: Diagnosis not present

## 2021-03-02 DIAGNOSIS — R918 Other nonspecific abnormal finding of lung field: Secondary | ICD-10-CM | POA: Diagnosis not present

## 2021-03-02 DIAGNOSIS — R82998 Other abnormal findings in urine: Secondary | ICD-10-CM | POA: Diagnosis not present

## 2021-03-02 DIAGNOSIS — I129 Hypertensive chronic kidney disease with stage 1 through stage 4 chronic kidney disease, or unspecified chronic kidney disease: Secondary | ICD-10-CM | POA: Diagnosis not present

## 2021-03-02 DIAGNOSIS — Z1339 Encounter for screening examination for other mental health and behavioral disorders: Secondary | ICD-10-CM | POA: Diagnosis not present

## 2021-03-02 NOTE — Telephone Encounter (Signed)
°  Follow up Call-  Call back number 02/26/2021  Post procedure Call Back phone  # [phone]  Permission to leave phone message Yes  Some recent data might be hidden     Patient questions:  Do you have a fever, pain , or abdominal swelling? No. Pain Score  0 *  Have you tolerated food without any problems? Yes.    Have you been able to return to your normal activities? Yes.    Do you have any questions about your discharge instructions: Diet   No. Medications  No. Follow up visit  No.  Do you have questions or concerns about your Care? No.  Actions: * If pain score is 4 or above: No action needed, pain <4.  Have you developed a fever since your procedure? no  2.   Have you had an respiratory symptoms (SOB or cough) since your procedure? no  3.   Have you tested positive for COVID 19 since your procedure no  4.   Have you had any family members/close contacts diagnosed with the COVID 19 since your procedure?  no   If yes to any of these questions please route to Joylene John, RN and Joella Prince, RN

## 2021-03-03 ENCOUNTER — Other Ambulatory Visit: Payer: Self-pay | Admitting: Internal Medicine

## 2021-03-03 ENCOUNTER — Encounter: Payer: Self-pay | Admitting: Internal Medicine

## 2021-03-03 DIAGNOSIS — E785 Hyperlipidemia, unspecified: Secondary | ICD-10-CM

## 2021-04-29 ENCOUNTER — Ambulatory Visit: Payer: PPO | Admitting: Neurology

## 2021-04-29 ENCOUNTER — Encounter: Payer: Self-pay | Admitting: Neurology

## 2021-04-29 ENCOUNTER — Other Ambulatory Visit: Payer: Self-pay

## 2021-04-29 VITALS — BP 131/72 | HR 66 | Ht 71.0 in | Wt 206.0 lb

## 2021-04-29 DIAGNOSIS — R29898 Other symptoms and signs involving the musculoskeletal system: Secondary | ICD-10-CM | POA: Diagnosis not present

## 2021-04-29 DIAGNOSIS — R208 Other disturbances of skin sensation: Secondary | ICD-10-CM | POA: Diagnosis not present

## 2021-04-29 NOTE — Progress Notes (Signed)
[facility]    Provider:  Dr Jaynee Eagles Requesting Provider: Crist Infante, MD Primary Care Provider:  Crist Infante, MD  CC:  left leg weakness  HPI:  Steven Hartman is a 76 y.o. male here as requested by Crist Infante, MD for peripheral polyneuropathy. PMHx hypertension, erectile dysfunction, DVT, back pain with radiculopathy, spondylolisthesis of the lumbar region, erythema chronicum migrans, factor V Leiden mutation, sciatic pain, alpha galactosidase deficiency, spinal stenosis with surgery in 2020, Lyme testing was negative in 2019, also was tested for [facility] spotted fever, had an ultrasound compression venography of the lower extremities, ultrasound showed DVT in 4 veins of the left leg lower thigh and below the knee August 2019 spontaneous unprovoked, MRI confirmed spinal stenosis at L4-L5, CKD GFR 59.9, has seen Dr. Arnoldo Morale at [facility] for low back surgery decompression and fusion L4-L5 and bone fusion in 2020.  I reviewed Dr. Tempie Donning notes, he drinks alcohol every day, he drinks 4 or more drinks a week, drinks wine, average 2 drinks per day, never smoker, he has tingling and numbness, mild neuropathy not changing.  Per Dr. Haynes Kerns, he needs a basic initial neuropathy evaluation possibly nerve conduction study as clinically warranted.  Patient is here alone and reports He has had back issus since 2008. He had surgery for stenosis in 2020 and since then he has weakness of the left > right leg. After surgery the weakness was more obvious. Before the surgery he had bilateral sciatica and that was improved after surgery. Prior to surgery he had  epidural steroid injections in 2018 and 2019 and when the sciatica became worse he had decompression surgery. He feels he has strong leg muscles at the gym, he can't o a toe raise with the left foot, a foot drop, but the left was worse. The pain improved after the surgery. The left leg weakness improved a little bit but  never came back to baseline and the left was the worst. The right leg is overall ok, not like the left leg. The weakness has been stable or slightly improved since 2020. The bottom of his feet feel strange sometimes like little sponges but no numbness or tingling in the toes or feet. No numbness in the hands, no weakness int he arms, no radicular cervical psin. No other focal neurologic deficits, associated symptoms, inciting events or modifiable factors.  Reviewed notes, labs and imaging from outside physicians, which showed:  Labs collected February 24, 2019 to include CMP with creatinine 1.1, BUN 20 otherwise normal, CBC unremarkable, TSH normal, vitamin D 46.5.  MRI lumbar spine 2008 (reviewed images with patient and agree: Clinical Data:  Bilateral buttock and leg pain for five months, worsening with activity.  No acute injury or prior back surgery.  MRI LUMBAR SPINE WITHOUT CONTRAST:  Technique:  Multiplanar and multiecho pulse sequences of the lumbar spine, to include the lower thoracic and upper sacral regions, were obtained according to standard protocol without IV contrast.  Comparison:  Lumbar spine radiographs 05/09/06.  Findings:  There are five lumbar type vertebral bodies in anatomic alignment.  There is no evidence of fracture or pars defect. The lumbar pedicles are relatively short on a congenital basis.  The conus medullaris extends to the mid L1 level and appears normal.  There are no paraspinal abnormalities.    Sagittal images demonstrate the T11-12 and T12-L1 discs to be normal.    L1-2:  Mild disc bulge.  L2-3:  Mild disc bulge.  L3-4:  Disc bulging and slight thickening of the ligamentum flavum result in borderline central and mild left lateral recess stenosis.  There is no foraminal compromise or definite nerve root encroachment.  L4-5:  There is moderate annular disc bulging with advanced bilateral facet hypertrophy, right greater than left.  There is a synovial cyst  extending medially from the right facet joint and contributing to right L5 nerve root compression. The ligamentum flava are thickened bilaterally, and there is underlying severe central and lateral recess stenosis bilaterally.  The neural foramina are mildly narrowed bilaterally without definite L4 nerve root encroachment.  L5-S1:  Minimal disc bulge.  Mild bilateral facet hypertrophy.  The left lateral recess is mildly narrowed without definite nerve root encroachment.  The foramina appear sufficiently patent.  IMPRESSION:  1.  The most significant findings are at L4-5 where there is moderate to severe multifactorial spinal stenosis due to annular disc bulging and bilateral facet hypertrophy.  A right sided synovial cyst contributes to right lateral recess stenosis and right L5 nerve root compression.  2.  Mild left lateral recess stenosis at both L3-4 and L5-S1 secondary to asymmetric facet hypertrophy.  The spinal canal appears adequately patent at those levels, and there is no foraminal compromise.     Review of Systems: Patient complains of symptoms per HPI as well as the following symptoms weakness. Pertinent negatives and positives per HPI. All others negative.   Social History   Socioeconomic History   Marital status: Married    Spouse name: Not on file   Number of children: Not on file   Years of education: Not on file   Highest education level: Not on file  Occupational History   Not on file  Tobacco Use   Smoking status: Never   Smokeless tobacco: Never  Vaping Use   Vaping Use: Never used  Substance and Sexual Activity   Alcohol use: Yes    Alcohol/week: 5.0 standard drinks    Types: 5 Standard drinks or equivalent per week    Comment: Wine   Drug use: Never   Sexual activity: Yes    Partners: Female  Other Topics Concern   Not on file  Social History Narrative   6-7 hours of sleep a night 2 people living in the home Drinks caffienated beverages Exercise at least 3  times a weekTakes 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       Caffeine: 2 cups/day of coffee   Right handed   Social Determinants of Health   Financial Resource Strain: Not on file  Food Insecurity: Not on file  Transportation Needs: Not on file  Physical Activity: Not on file  Stress: Not on file  Social Connections: Not on file  Intimate Partner Violence: Not on file    Family History  Problem Relation Age of Onset   Parkinson's disease Mother 72   Arthritis Father    Rheum arthritis Father    Rheum arthritis Sister    Colon polyps Brother    Prostate cancer Brother    Colon cancer Neg Hx    Neuropathy Neg Hx     Past Medical History:  Diagnosis Date   Allergy to alpha-gal    result of a tick bite   Factor 5 Leiden mutation, heterozygous ([place])    GERD (gastroesophageal reflux disease)    History of chicken pox    History of colon polyps    Hypertension    Sciatic  pain    Synovial cyst of lumbar spine     Patient Active Problem List   Diagnosis Date Noted   Left leg weakness 04/29/2021   Decreased sensation of foot 04/29/2021   Spondylolisthesis, lumbar region 03/22/2018   Acute deep vein thrombosis (DVT) of left lower extremity ([place]) 11/11/2017   Allergy to alpha-gal 11/10/2017   Vitamin D deficiency 08/25/2017   ECM (erythema chronicum migrans) 08/24/2017   Need for hepatitis C screening test 08/24/2017   Erectile dysfunction due to arterial insufficiency 08/24/2017   Essential hypertension 07/11/2014   Routine general medical examination at a health care facility 04/24/2013   Personal history of colonic polyps 09/30/2009   BACK PAIN WITH RADICULOPATHY 05/19/2007    Past Surgical History:  Procedure Laterality Date   BACK SURGERY  2020   lumbar spine   COLONOSCOPY  multiple   TONSILLECTOMY AND ADENOIDECTOMY  03/16/1971    Current Outpatient Medications  Medication Sig Dispense  Refill   apixaban (ELIQUIS) 2.5 MG TABS tablet Take 2.5 mg by mouth 2 (two) times daily.     EPINEPHrine 0.3 mg/0.3 mL IJ SOAJ injection      fexofenadine (ALLEGRA) 180 MG tablet Take 180 mg by mouth daily as needed for allergies or rhinitis.     MICARDIS 80 MG tablet TAKE 1 TABLET BY MOUTH DAILY (Patient taking differently: at bedtime.) 90 tablet 0   Multiple Vitamins-Minerals (MULTIVITAMIN PO) Take 1 tablet by mouth daily.     rosuvastatin (CRESTOR) 20 MG tablet Take 20 mg by mouth at bedtime.     sildenafil (REVATIO) 20 MG tablet Take 4 tablets (80 mg total) by mouth daily as needed. (Patient taking differently: Take 80 mg by mouth daily as needed (for ED).) 60 tablet 11   triamcinolone (NASACORT) 55 MCG/ACT AERO nasal inhaler Place 2 sprays into the nose daily as needed (for allergies).      VITAMIN D, CHOLECALCIFEROL, PO Take 5,000 Units by mouth daily.     No current facility-administered medications for this visit.    Allergies as of 04/29/2021 - Review Complete 04/29/2021  Allergen Reaction Noted   Gelatin Other (See Comments) 03/21/2018   Meat [alpha-gal] Hives, Rash, and Other (See Comments) 03/13/2018    Vitals: BP 131/72 (BP Location: Right Arm, Patient Position: Sitting, Cuff Size: Large)    Pulse 66    Ht 5\' 11"  (1.803 m)    Wt 206 lb (93.4 kg)    BMI 28.73 kg/m  Last Weight:  Wt Readings from Last 1 Encounters:  04/29/21 206 lb (93.4 kg)   Last Height:   Ht Readings from Last 1 Encounters:  04/29/21 5\' 11"  (1.803 m)     Physical exam: Exam: Gen: NAD, conversant, well nourised, well groomed                     CV: RRR, no MRG. No Carotid Bruits. No peripheral edema, warm, nontender Eyes: Conjunctivae clear without exudates or hemorrhage  Neuro: Detailed Neurologic Exam  Speech:    Speech is normal; fluent and spontaneous with normal comprehension.  Cognition:    The patient is oriented to person, place, and time;     recent and remote memory intact;      language fluent;     normal attention, concentration,     fund of knowledge Cranial Nerves:    The pupils are equal, round, and reactive to light. The fundi are flat Visual fields are full to finger confrontation.  Extraocular movements are intact. Trigeminal sensation is intact and the muscles of mastication are normal. The face is symmetric. The palate elevates in the midline. Hearing intact. Voice is normal. Shoulder shrug is normal. The tongue has normal motion without fasciculations.   Coordination:    Normal    Gait:    normal.   Motor Observation:    No asymmetry, no atrophy, and no involuntary movements noted. Tone:    Normal muscle tone.    Posture:    Posture is normal. normal erect    Strength: 4-/5 left hip flexion and left toe extension. Otherwise strength is V/V in the upper and lower limbs.      Sensation: decrease to pin prick and temp slightly distally, vibration a few seconds at the proximal joint, proprioception is intact.      Reflex Exam:  DTR's:    Absent Ajs deep tendon reflexes in the upper and lower extremities are 2+ bilaterally.   Toes:    The toes are downgoing bilaterally.   Clonus:    Clonus is absent.    Assessment/Plan: This is a very nice 76 year old patient who has had chronic back pain and degenerative disease since at least 2008 who reports residual left leg weakness after L4-L5 decompression, instrumentation and fusion in 2020 due to lumbar spinal stenosis with neurogenic claudication and lumbar radiculopathy.  -He has residual very mild left hip flexion and toe extension weakness.  He reports this has improved since surgery.  He has no new symptoms.  I believe this is residual from his low back issues and given that he does not have any new symptoms and he is actually improved since surgery I do not think an EMG nerve conduction study would provide [place] with any additional information.  -On examination I did notice a decrease distally in the  feet to pinprick, temperature, vibration consistent with a very mild peripheral neuropathy.  He has no risk factors as far as I can see, I will order a few lab tests, it does not appear to bother him or be progressive and could be partially residual from his radiculopathy status post surgery.  I will order several blood tests and monitor clinically.  He does drink 2 glasses of wine a night and I question whether long-term alcohol use, even if it is within the accepted guidelines, can sometimes cause a mild distal peripheral polyneuropathy.  - RTC as needed.  If he has new or worsening symptoms we can order an EMG nerve conduction study and I did discuss this with him.  He can contact me through [facility] as needed.  Orders Placed This Encounter  Procedures   Hemoglobin A1c   Vitamin B1   B12 and Folate Panel   Methylmalonic acid, serum   Vitamin B6   No orders of the defined types were placed in this encounter.   Cc: Crist Infante, MD,  Crist Infante, MD  Sarina Ill, MD  [facility] [address] [place],  [address]  Phone [phone] Fax [phone]  I spent over 60 minutes of face-to-face and non-face-to-face time with patient on the  1. Left leg weakness   2. Decreased sensation of foot    diagnosis.  This included previsit chart review, lab review, study review, order entry, electronic health record documentation, patient education on the different diagnostic and therapeutic options, counseling and coordination of care, risks and benefits of management, compliance, or risk factor reduction

## 2021-04-29 NOTE — Patient Instructions (Addendum)
Blood work If worsening can perform emg/ncs   Electromyoneurogram Electromyoneurogram is a test to check how well your muscles and nerves are working. This procedure includes the combined use of electromyogram (EMG) and nerve conduction study (NCS). EMG is used to evaluate muscles and the nerves that control those muscles. NCS, which is also called electroneurogram, measures how well your nerves conduct electricity. The procedures should be done together to check if your muscles and nerves are healthy. If the results of the tests are abnormal, this may indicate disease or injury, such as a neuromuscular disease or peripheral nerve damage. Tell a health care provider about: Any allergies you have. All medicines you are taking, including vitamins, herbs, eye drops, creams, and over-the-counter medicines. Any bleeding problems you have. Any surgeries you have had. Any medical conditions you have. What are the risks? Generally, this is a safe procedure. However, problems may occur, including: Bleeding or bruising. Infection where the electrodes were inserted. What happens before the test? Medicines Take all of your usually prescribed medications before this testing is performed. Do not stop your blood thinners unless advised by your prescribing physician. General instructions Your health care provider may ask you to warm the limb that will be checked with warm water, hot pack, or wrapping the limb in a blanket. Do not use lotions or creams on the same day that you will be having the procedure. What happens during the test? For EMG  Your health care provider will ask you to stay in a position so that the muscle being studied can be accessed. You will be sitting or lying down. You may be given a medicine to numb the area (local anesthetic) and the skin will be disinfected. A very thin needle that has an electrode will be inserted into your muscle, one muscle at a time. Typically, multiple  muscles are evaluated during a single study. Another small electrode will be placed on your skin near the muscle. Your health care provider will ask you to continue to remain still. The electrodes will record the electrical activity of your muscles. You may see this on a monitor or hear it in the room. After your muscles have been studied at rest, your health care provider will ask you to contract or flex your muscles. The electrodes will record the electrical activity of your muscles. Your health care provider will remove the electrodes and the electrode needle when the procedure is finished. The procedure may vary among health care providers and hospitals. For NCS  An electrode that records your nerve activity (recording electrode) will be placed on your skin by the muscle that is being studied. An electrode that is used as a reference (reference electrode) will be placed near the recording electrode. A paste or gel will be applied to your skin between the recording electrode and the reference electrode. Your nerve will be stimulated with a mild shock. The speed of the nerves and strength of response is recorded by the electrodes. Your health care provider will remove the electrodes and the gel when the procedure is finished. The procedure may vary among health care providers and hospitals. What can I expect after the test? It is up to you to get your test results. Ask your health care provider, or the department that is doing the test, when your results will be ready. Your health care provider may: Give you medicines for any pain. Monitor the insertion sites to make sure that bleeding stops. You should be able to  drive yourself to and from the test. Discomfort can persist for a few hours after the test, but should be better the next day. Contact a health care provider if: You have swelling, redness, or drainage at any of the insertion sites. Summary Electromyoneurogram is a test to check  how well your muscles and nerves are working. If the results of the tests are abnormal, this may indicate disease or injury. This is a safe procedure. However, problems may occur, such as bleeding and infection. Your health care provider will do two tests to complete this procedure. One checks your muscles (EMG) and another checks your nerves (NCS). It is up to you to get your test results. Ask your health care provider, or the department that is doing the test, when your results will be ready. This information is not intended to replace advice given to you by your health care provider. Make sure you discuss any questions you have with your health care provider. Document Revised: 11/12/2020 Document Reviewed: 10/12/2020 Elsevier Patient Education  2022 [place].   Peripheral Neuropathy Peripheral neuropathy is a type of nerve damage. It affects nerves that carry signals between the spinal cord and the arms, legs, and the rest of the body (peripheral nerves). It does not affect nerves in the spinal cord or brain. In peripheral neuropathy, one nerve or a group of nerves may be damaged. Peripheral neuropathy is a broad category that includes many specific nerve disorders, like diabetic neuropathy, hereditary neuropathy, and carpal tunnel syndrome. What are the causes? This condition may be caused by: Diabetes. This is the most common cause of peripheral neuropathy. Nerve injury. Pressure or stress on a nerve that lasts a long time. Lack (deficiency) of B vitamins. This can result from alcoholism, poor diet, or a restricted diet. Infections. Autoimmune diseases, such as rheumatoid arthritis and systemic lupus erythematosus. Nerve diseases that are passed from parent to child (inherited). Some medicines, such as cancer medicines (chemotherapy). Poisonous (toxic) substances, such as lead and mercury. Too little blood flowing to the legs. Kidney disease. Thyroid disease. Obesity Chronic  alcohol use

## 2021-04-30 NOTE — Telephone Encounter (Signed)
-----   Message from Melvenia Beam, MD sent at 04/30/2021 10:37 AM EST ----- Patient has B12 deficiency. B12 226. This is likely why I noticed a distal peripheral neuropathy. 1042mcg b12 shots weekly for 4 weeks and then once a month for 6 months and then 1046mcg B12 daily po long term and follow up with Dr. Joylene Draft  B12 deficiency can cause many problems: Some of the things B12 deficiency can cause is (severe) peripheral neuropathy, weakness, fatigue, easy bruising or bleeding,mood changes, depression, memory loss, disorientation and, in severe cases, dementia. Recommend treating as above with Dr. Joylene Draft. (Cc Dr. Joylene Draft)

## 2021-05-05 LAB — VITAMIN B1: Thiamine: 180.8 nmol/L (ref 66.5–200.0)

## 2021-05-05 LAB — METHYLMALONIC ACID, SERUM: Methylmalonic Acid: 266 nmol/L (ref 0–378)

## 2021-05-05 LAB — HEMOGLOBIN A1C
Est. average glucose Bld gHb Est-mCnc: 111 mg/dL
Hgb A1c MFr Bld: 5.5 % (ref 4.8–5.6)

## 2021-05-05 LAB — B12 AND FOLATE PANEL
Folate: 19.4 ng/mL (ref >3.0)
Vitamin B-12: 226 pg/mL — ABNORMAL LOW (ref 232–1245)

## 2021-05-05 LAB — VITAMIN B6: Vitamin B6: 36.7 ug/L (ref 3.4–65.2)

## 2021-05-28 ENCOUNTER — Ambulatory Visit
Admission: RE | Admit: 2021-05-28 | Discharge: 2021-05-28 | Disposition: A | Discharge status: Home / Self Care | Payer: PPO | Source: Ambulatory Visit | Attending: Internal Medicine | Admitting: Internal Medicine

## 2021-05-28 DIAGNOSIS — E785 Hyperlipidemia, unspecified: Secondary | ICD-10-CM

## 2021-07-02 DIAGNOSIS — Z86718 Personal history of other venous thrombosis and embolism: Secondary | ICD-10-CM | POA: Diagnosis not present

## 2021-07-02 DIAGNOSIS — D682 Hereditary deficiency of other clotting factors: Secondary | ICD-10-CM | POA: Diagnosis not present

## 2021-07-02 DIAGNOSIS — E785 Hyperlipidemia, unspecified: Secondary | ICD-10-CM | POA: Diagnosis not present

## 2021-07-02 DIAGNOSIS — N529 Male erectile dysfunction, unspecified: Secondary | ICD-10-CM | POA: Diagnosis not present

## 2021-07-02 DIAGNOSIS — I10 Essential (primary) hypertension: Secondary | ICD-10-CM | POA: Diagnosis not present

## 2021-07-02 DIAGNOSIS — M5136 Other intervertebral disc degeneration, lumbar region: Secondary | ICD-10-CM | POA: Diagnosis not present

## 2021-07-02 DIAGNOSIS — G8929 Other chronic pain: Secondary | ICD-10-CM | POA: Diagnosis not present

## 2021-07-02 DIAGNOSIS — E538 Deficiency of other specified B group vitamins: Secondary | ICD-10-CM | POA: Diagnosis not present

## 2021-07-02 DIAGNOSIS — I2510 Atherosclerotic heart disease of native coronary artery without angina pectoris: Secondary | ICD-10-CM | POA: Diagnosis not present

## 2021-07-02 DIAGNOSIS — E663 Overweight: Secondary | ICD-10-CM | POA: Diagnosis not present

## 2021-07-02 DIAGNOSIS — I700 Atherosclerosis of aorta: Secondary | ICD-10-CM | POA: Diagnosis not present

## 2021-08-25 ENCOUNTER — Other Ambulatory Visit: Payer: Self-pay | Admitting: *Deleted

## 2021-08-25 ENCOUNTER — Telehealth: Payer: Self-pay | Admitting: *Deleted

## 2021-08-25 ENCOUNTER — Other Ambulatory Visit (INDEPENDENT_AMBULATORY_CARE_PROVIDER_SITE_OTHER): Payer: PPO

## 2021-08-25 ENCOUNTER — Other Ambulatory Visit: Payer: Self-pay

## 2021-08-25 DIAGNOSIS — R29898 Other symptoms and signs involving the musculoskeletal system: Secondary | ICD-10-CM | POA: Diagnosis not present

## 2021-08-25 DIAGNOSIS — R208 Other disturbances of skin sensation: Secondary | ICD-10-CM

## 2021-08-25 DIAGNOSIS — Z0289 Encounter for other administrative examinations: Secondary | ICD-10-CM

## 2021-08-25 NOTE — Telephone Encounter (Signed)
Pt here for lab.  Was some confusion about if pcp or [place] doing this.  Finally found in the pt message chain from 04-29-2021 that will take 1000-2045mg daily come back in 8 wks for recheck.  [place]  Lab B12 ordered by Dr. AJaynee Eagles  Pt shown that messages did exist.  Apologized for confusion.  He is to wait and have lab drawn.  He verbalized understanding of plan.

## 2021-08-26 LAB — VITAMIN B12: Vitamin B-12: 1274 pg/mL — ABNORMAL HIGH (ref 232–1245)

## 2021-09-17 DIAGNOSIS — I10 Essential (primary) hypertension: Secondary | ICD-10-CM | POA: Diagnosis not present

## 2021-09-17 DIAGNOSIS — E785 Hyperlipidemia, unspecified: Secondary | ICD-10-CM | POA: Diagnosis not present

## 2021-10-27 DIAGNOSIS — M4316 Spondylolisthesis, lumbar region: Secondary | ICD-10-CM | POA: Diagnosis not present

## 2021-10-27 DIAGNOSIS — Z6828 Body mass index (BMI) 28.0-28.9, adult: Secondary | ICD-10-CM | POA: Diagnosis not present

## 2021-12-01 DIAGNOSIS — H532 Diplopia: Secondary | ICD-10-CM | POA: Diagnosis not present

## 2021-12-01 DIAGNOSIS — H10413 Chronic giant papillary conjunctivitis, bilateral: Secondary | ICD-10-CM | POA: Diagnosis not present

## 2021-12-01 DIAGNOSIS — H538 Other visual disturbances: Secondary | ICD-10-CM | POA: Diagnosis not present

## 2021-12-01 DIAGNOSIS — H11121 Conjunctival concretions, right eye: Secondary | ICD-10-CM | POA: Diagnosis not present

## 2021-12-01 DIAGNOSIS — H11443 Conjunctival cysts, bilateral: Secondary | ICD-10-CM | POA: Diagnosis not present

## 2021-12-01 DIAGNOSIS — H04123 Dry eye syndrome of bilateral lacrimal glands: Secondary | ICD-10-CM | POA: Diagnosis not present

## 2021-12-01 DIAGNOSIS — H2513 Age-related nuclear cataract, bilateral: Secondary | ICD-10-CM | POA: Diagnosis not present

## 2021-12-01 DIAGNOSIS — H02834 Dermatochalasis of left upper eyelid: Secondary | ICD-10-CM | POA: Diagnosis not present

## 2021-12-01 DIAGNOSIS — H02831 Dermatochalasis of right upper eyelid: Secondary | ICD-10-CM | POA: Diagnosis not present

## 2022-01-09 DIAGNOSIS — Z23 Encounter for immunization: Secondary | ICD-10-CM | POA: Diagnosis not present

## 2022-03-23 DIAGNOSIS — Z125 Encounter for screening for malignant neoplasm of prostate: Secondary | ICD-10-CM | POA: Diagnosis not present

## 2022-03-23 DIAGNOSIS — I700 Atherosclerosis of aorta: Secondary | ICD-10-CM | POA: Diagnosis not present

## 2022-03-23 DIAGNOSIS — R7989 Other specified abnormal findings of blood chemistry: Secondary | ICD-10-CM | POA: Diagnosis not present

## 2022-03-23 DIAGNOSIS — E785 Hyperlipidemia, unspecified: Secondary | ICD-10-CM | POA: Diagnosis not present

## 2022-03-29 DIAGNOSIS — E559 Vitamin D deficiency, unspecified: Secondary | ICD-10-CM | POA: Diagnosis not present

## 2022-03-29 DIAGNOSIS — M48061 Spinal stenosis, lumbar region without neurogenic claudication: Secondary | ICD-10-CM | POA: Diagnosis not present

## 2022-03-29 DIAGNOSIS — I824Y9 Acute embolism and thrombosis of unspecified deep veins of unspecified proximal lower extremity: Secondary | ICD-10-CM | POA: Diagnosis not present

## 2022-03-29 DIAGNOSIS — E785 Hyperlipidemia, unspecified: Secondary | ICD-10-CM | POA: Diagnosis not present

## 2022-03-29 DIAGNOSIS — Z91018 Allergy to other foods: Secondary | ICD-10-CM | POA: Diagnosis not present

## 2022-03-29 DIAGNOSIS — R82998 Other abnormal findings in urine: Secondary | ICD-10-CM | POA: Diagnosis not present

## 2022-03-29 DIAGNOSIS — E538 Deficiency of other specified B group vitamins: Secondary | ICD-10-CM | POA: Diagnosis not present

## 2022-03-29 DIAGNOSIS — I2510 Atherosclerotic heart disease of native coronary artery without angina pectoris: Secondary | ICD-10-CM | POA: Diagnosis not present

## 2022-03-29 DIAGNOSIS — I700 Atherosclerosis of aorta: Secondary | ICD-10-CM | POA: Diagnosis not present

## 2022-03-29 DIAGNOSIS — Z23 Encounter for immunization: Secondary | ICD-10-CM | POA: Diagnosis not present

## 2022-03-29 DIAGNOSIS — Z0000 Encounter for general adult medical examination without abnormal findings: Secondary | ICD-10-CM | POA: Diagnosis not present

## 2022-03-29 DIAGNOSIS — D6851 Activated protein C resistance: Secondary | ICD-10-CM | POA: Diagnosis not present

## 2022-03-29 DIAGNOSIS — I10 Essential (primary) hypertension: Secondary | ICD-10-CM | POA: Diagnosis not present

## 2022-04-24 ENCOUNTER — Emergency Department (HOSPITAL_COMMUNITY)
Admission: EM | Admit: 2022-04-24 | Discharge: 2022-04-24 | Disposition: A | Discharge status: Home / Self Care | Payer: PPO | Attending: Emergency Medicine | Admitting: Emergency Medicine

## 2022-04-24 ENCOUNTER — Other Ambulatory Visit: Payer: Self-pay

## 2022-04-24 ENCOUNTER — Emergency Department (HOSPITAL_COMMUNITY): Payer: PPO

## 2022-04-24 ENCOUNTER — Encounter (HOSPITAL_COMMUNITY): Payer: Self-pay

## 2022-04-24 DIAGNOSIS — Z79899 Other long term (current) drug therapy: Secondary | ICD-10-CM | POA: Insufficient documentation

## 2022-04-24 DIAGNOSIS — G319 Degenerative disease of nervous system, unspecified: Secondary | ICD-10-CM | POA: Diagnosis not present

## 2022-04-24 DIAGNOSIS — H02431 Paralytic ptosis of right eyelid: Secondary | ICD-10-CM | POA: Diagnosis not present

## 2022-04-24 DIAGNOSIS — R7309 Other abnormal glucose: Secondary | ICD-10-CM | POA: Diagnosis not present

## 2022-04-24 DIAGNOSIS — H532 Diplopia: Secondary | ICD-10-CM | POA: Insufficient documentation

## 2022-04-24 DIAGNOSIS — H5702 Anisocoria: Secondary | ICD-10-CM | POA: Diagnosis not present

## 2022-04-24 DIAGNOSIS — I10 Essential (primary) hypertension: Secondary | ICD-10-CM | POA: Diagnosis not present

## 2022-04-24 DIAGNOSIS — Z7901 Long term (current) use of anticoagulants: Secondary | ICD-10-CM | POA: Insufficient documentation

## 2022-04-24 DIAGNOSIS — Y900 Blood alcohol level of less than 20 mg/100 ml: Secondary | ICD-10-CM | POA: Diagnosis not present

## 2022-04-24 DIAGNOSIS — R059 Cough, unspecified: Secondary | ICD-10-CM | POA: Diagnosis not present

## 2022-04-24 DIAGNOSIS — Z0389 Encounter for observation for other suspected diseases and conditions ruled out: Secondary | ICD-10-CM | POA: Diagnosis not present

## 2022-04-24 LAB — COMPREHENSIVE METABOLIC PANEL
ALT: 26 U/L (ref 0–44)
AST: 22 U/L (ref 15–41)
Albumin: 3.9 g/dL (ref 3.5–5.0)
Alkaline Phosphatase: 43 U/L (ref 38–126)
Anion gap: 6 (ref 5–15)
BUN: 11 mg/dL (ref 8–23)
CO2: 26 mmol/L (ref 22–32)
Calcium: 9.4 mg/dL (ref 8.9–10.3)
Chloride: 105 mmol/L (ref 98–111)
Creatinine, Ser: 1.25 mg/dL — ABNORMAL HIGH (ref 0.61–1.24)
GFR, Estimated: >60 mL/min (ref >60)
Glucose, Bld: 121 mg/dL — ABNORMAL HIGH (ref 70–99)
Potassium: 4.5 mmol/L (ref 3.5–5.1)
Sodium: 137 mmol/L (ref 135–145)
Total Bilirubin: 0.8 mg/dL (ref 0.3–1.2)
Total Protein: 6.3 g/dL — ABNORMAL LOW (ref 6.5–8.1)

## 2022-04-24 LAB — DIFFERENTIAL
Abs Immature Granulocytes: 0.03 10*3/uL (ref 0.00–0.07)
Basophils Absolute: 0.1 10*3/uL (ref 0.0–0.1)
Basophils Relative: 1 %
Eosinophils Absolute: 0.2 10*3/uL (ref 0.0–0.5)
Eosinophils Relative: 3 %
Immature Granulocytes: 0 %
Lymphocytes Relative: 20 %
Lymphs Abs: 1.6 10*3/uL (ref 0.7–4.0)
Monocytes Absolute: 0.7 10*3/uL (ref 0.1–1.0)
Monocytes Relative: 9 %
Neutro Abs: 5.2 10*3/uL (ref 1.7–7.7)
Neutrophils Relative %: 67 %

## 2022-04-24 LAB — PROTIME-INR
INR: 1.1 (ref 0.8–1.2)
Prothrombin Time: 14 seconds (ref 11.4–15.2)

## 2022-04-24 LAB — I-STAT CHEM 8, ED
BUN: 13 mg/dL (ref 8–23)
Calcium, Ion: 1.29 mmol/L (ref 1.15–1.40)
Chloride: 105 mmol/L (ref 98–111)
Creatinine, Ser: 1.2 mg/dL (ref 0.61–1.24)
Glucose, Bld: 118 mg/dL — ABNORMAL HIGH (ref 70–99)
HCT: 48 % (ref 39.0–52.0)
Hemoglobin: 16.3 g/dL (ref 13.0–17.0)
Potassium: 4.6 mmol/L (ref 3.5–5.1)
Sodium: 141 mmol/L (ref 135–145)
TCO2: 26 mmol/L (ref 22–32)

## 2022-04-24 LAB — CBC
HCT: 47.1 % (ref 39.0–52.0)
Hemoglobin: 16.1 g/dL (ref 13.0–17.0)
MCH: 31.6 pg (ref 26.0–34.0)
MCHC: 34.2 g/dL (ref 30.0–36.0)
MCV: 92.5 fL (ref 80.0–100.0)
Platelets: 203 10*3/uL (ref 150–400)
RBC: 5.09 MIL/uL (ref 4.22–5.81)
RDW: 12.6 % (ref 11.5–15.5)
WBC: 7.8 10*3/uL (ref 4.0–10.5)
nRBC: 0 % (ref 0.0–0.2)

## 2022-04-24 LAB — APTT: aPTT: 29 seconds (ref 24–36)

## 2022-04-24 LAB — CBG MONITORING, ED: Glucose-Capillary: 134 mg/dL — ABNORMAL HIGH (ref 70–99)

## 2022-04-24 LAB — ETHANOL: Alcohol, Ethyl (B): <10 mg/dL (ref <10)

## 2022-04-24 MED ORDER — SODIUM CHLORIDE 0.9% FLUSH: 3.0000 mL | Freq: Once | INTRAVENOUS | Status: DC

## 2022-04-24 MED ORDER — GADOBUTROL 1 MMOL/ML IV SOLN
9.3000 mL | Freq: Once | INTRAVENOUS | Status: AC | PRN
  Administered 2022-04-24: 9.3 mL via INTRAVENOUS

## 2022-04-24 NOTE — ED Provider Notes (Addendum)
[facility] Provider Note   CSN: [id number] Arrival date & time: 04/24/22  1318     History  Chief Complaint  Patient presents with   Diplopia    Steven Hartman is a 77 y.o. male.  Patient sent from ophthalmologist for stroke workup.  He has history of double vision in his eyes in the last several years but has been having some double vision since yesterday morning at 11 AM.  Patient reports that he has noticed double vision but now its mostly improved.  Resolves when he closes 1 eye.  With both eyes open when he looks in a distance he sees double but when he closes 1 eye it gets better.  Patient has a history of factor V Leiden and is on Eliquis.  Denies any chest pain or shortness of breath.  Never smoker.  History of high cholesterol.  He also has some drooping of his right eyelid yesterday that has gotten better today.  He tells me that neurologist thought that he needed to get a stroke workup done with an MRI and there was a concern for may be some myasthenia gravis.  He was told that his left pupil was bigger than his right pupil.  The history is provided by the patient.       Home Medications Prior to Admission medications   Medication Sig Start Date End Date Taking? Authorizing Provider  apixaban (ELIQUIS) 2.5 MG TABS tablet Take 2.5 mg by mouth 2 (two) times daily.    [provider]  Cyanocobalamin 2500 MCG SUBL Place 1 tablet under the tongue daily.    [provider]  EPINEPHrine 0.3 mg/0.3 mL IJ SOAJ injection  12/06/17   [provider]  fexofenadine (ALLEGRA) 180 MG tablet Take 180 mg by mouth daily as needed for allergies or rhinitis.    [provider]  MICARDIS 80 MG tablet TAKE 1 TABLET BY MOUTH DAILY Patient taking differently: at bedtime. 03/06/18   Hoyt Koch, MD  Multiple Vitamins-Minerals (MULTIVITAMIN PO) Take 1 tablet by mouth daily.    [provider]   rosuvastatin (CRESTOR) 20 MG tablet Take 20 mg by mouth at bedtime. 11/24/20   [provider]  sildenafil (REVATIO) 20 MG tablet Take 4 tablets (80 mg total) by mouth daily as needed. Patient taking differently: Take 80 mg by mouth daily as needed (for ED). 08/24/17   Janith Lima, MD  triamcinolone (NASACORT) 55 MCG/ACT AERO nasal inhaler Place 2 sprays into the nose daily as needed (for allergies).     [provider]  VITAMIN D, CHOLECALCIFEROL, PO Take 5,000 Units by mouth daily.    [provider]      Allergies    Gelatin and Meat [alpha-gal]    Review of Systems   Review of Systems  Physical Exam Updated Vital Signs BP (!) 156/95 (BP Location: Right Arm)   Pulse 65   Temp 98 F (36.7 C) (Oral)   Resp 17   Ht 5' 11"$  (1.803 m)   Wt 93.4 kg   SpO2 96%   BMI 28.73 kg/m  Physical Exam Vitals and nursing note reviewed.  Constitutional:      General: He is not in acute distress.    Appearance: He is well-developed. He is not ill-appearing.  HENT:     Head: Normocephalic and atraumatic.  Eyes:     Extraocular Movements: Extraocular movements intact.     Conjunctiva/sclera:  Conjunctivae normal.     Pupils: Pupils are equal, round, and reactive to light.     Comments: Left pupil mildly larger than the right pupil, extraocular motions are intact, visual fields are intact.  Cardiovascular:     Rate and Rhythm: Normal rate and regular rhythm.     Heart sounds: No murmur heard. Pulmonary:     Effort: Pulmonary effort is normal. No respiratory distress.     Breath sounds: Normal breath sounds.  Abdominal:     Palpations: Abdomen is soft.     Tenderness: There is no abdominal tenderness.  Musculoskeletal:        General: No swelling.     Cervical back: Normal range of motion and neck supple.  Skin:    General: Skin is warm and dry.     Capillary Refill: Capillary refill takes less than 2 seconds.  Neurological:     General: No focal deficit  present.     Mental Status: He is alert and oriented to person, place, and time.     Cranial Nerves: No cranial nerve deficit.     Sensory: No sensory deficit.     Motor: No weakness.     Coordination: Coordination normal.     Comments: 5+ out of 5 strength throughout, normal sensation, no drift, normal finger-nose-finger, normal speech  Psychiatric:        Mood and Affect: Mood normal.     ED Results / Procedures / Treatments   Labs (all labs ordered are listed, but only abnormal results are displayed) Labs Reviewed  COMPREHENSIVE METABOLIC PANEL - Abnormal; Notable for the following components:      Result Value   Glucose, Bld 121 (*)    Creatinine, Ser 1.25 (*)    Total Protein 6.3 (*)    All other components within normal limits  I-STAT CHEM 8, ED - Abnormal; Notable for the following components:   Glucose, Bld 118 (*)    All other components within normal limits  CBG MONITORING, ED - Abnormal; Notable for the following components:   Glucose-Capillary 134 (*)    All other components within normal limits  PROTIME-INR  APTT  CBC  DIFFERENTIAL  ETHANOL  ACETYLCHOLINE RECEPTOR, BINDING  MUSK ANTIBODIES  STRIATED MUSCLE ANTIBODY    EKG EKG Interpretation  Date/Time:  Saturday April 24 2022 13:32:27 EST Ventricular Rate:  70 PR Interval:  200 QRS Duration: 68 QT Interval:  368 QTC Calculation: 397 R Axis:   42 Text Interpretation: Normal sinus rhythm Confirmed by Lennice Sites ([id number]) on 04/24/2022 3:33:53 PM  Radiology MR Brain W and Wo Contrast  Result Date: 04/24/2022 CLINICAL DATA:  Neuro deficit, acute, stroke suspected. Diplopia. Hoarseness. EXAM: MRI HEAD WITHOUT AND WITH CONTRAST TECHNIQUE: Multiplanar, multiecho pulse sequences of the brain and surrounding structures were obtained without and with intravenous contrast. CONTRAST:  9.16m GADAVIST GADOBUTROL 1 MMOL/ML IV SOLN COMPARISON:  Head CT 04/24/2022 FINDINGS: Brain: There is no evidence of an acute  infarct, intracranial hemorrhage, mass, midline shift, or extra-axial fluid collection. Mild cerebral atrophy is within normal limits for age. Small T2 hyperintensities in the cerebral white matter bilaterally are nonspecific but compatible with minimal chronic small vessel ischemic disease. There is a chronic lacunar infarct in the left caudate head. No abnormal enhancement is identified. Vascular: Major intracranial vascular flow voids are preserved. Skull and upper cervical spine: Unremarkable bone marrow signal. Sinuses/Orbits: Unremarkable orbits. Mild mucosal thickening in the paranasal sinuses. Mucous retention cyst in the right  maxillary sinus. No significant mastoid fluid. Other: None. IMPRESSION: 1. No acute intracranial abnormality. 2. Minimal chronic small vessel ischemic disease. Electronically Signed   By: Logan Bores M.D.   On: 04/24/2022 18:46   DG Chest Portable 1 View  Result Date: 04/24/2022 CLINICAL DATA:  Cough EXAM: PORTABLE CHEST 1 VIEW COMPARISON:  None Available. FINDINGS: The heart size and mediastinal contours are within normal limits. Both lungs are clear. The visualized skeletal structures are unremarkable. IMPRESSION: No active disease. Electronically Signed   By: Ronney Asters M.D.   On: 04/24/2022 15:50   CT HEAD WO CONTRAST  Result Date: 04/24/2022 CLINICAL DATA:  Diplopia. EXAM: CT HEAD WITHOUT CONTRAST TECHNIQUE: Contiguous axial images were obtained from the base of the skull through the vertex without intravenous contrast. RADIATION DOSE REDUCTION: This exam was performed according to the departmental dose-optimization program which includes automated exposure control, adjustment of the mA and/or kV according to patient size and/or use of iterative reconstruction technique. COMPARISON:  None Available. FINDINGS: Brain: No evidence of intracranial hemorrhage, acute infarction, hydrocephalus, extra-axial collection, or mass lesion/mass effect. Mild diffuse cerebral atrophy  noted. Vascular:  No hyperdense vessel or other acute findings. Skull: No evidence of fracture or other significant bone abnormality. Sinuses/Orbits:  No acute findings. Other: None. IMPRESSION: No acute intracranial abnormality. Mild cerebral atrophy. Electronically Signed   By: Marlaine Hind M.D.   On: 04/24/2022 14:33    Procedures Procedures    Medications Ordered in ED Medications  sodium chloride flush (NS) 0.9 % injection 3 mL (has no administration in time range)  gadobutrol (GADAVIST) 1 MMOL/ML injection 9.3 mL (9.3 mLs Intravenous Contrast Given 04/24/22 1810)    ED Course/ Medical Decision Making/ A&P                             Medical Decision Making Amount and/or Complexity of Data Reviewed Labs: ordered. Radiology: ordered.  Risk Prescription drug management.   STARSKY OZAETA is here for further workup from ophthalmologist office.  History of double vision, factor V Leiden on Eliquis, acid reflux.  Per ophthalmologist note they are requesting an MRI of the brain with and without as well as MRI of the chest.  Their concern is for may be mass or tumor may be MS or hoarder syndrome.  May be myasthenia gravis.  Since patient has some ptosis of the right eye with some double vision and slightly larger left pupil and right they thought needed further workup.  His neurological exam is overall unremarkable.  His left pupil does look mildly bigger than the right.  I do not notice any major ptosis.  He has no visual field deficits.  He does not appear to have any double vision currently.  Neurology to be consulted.  Ophthalmology was recommended acetylcholine receptor antibodies, musk antibodies, antistriated muscle antibodies.  He is already had blood work done prior to my evaluation including head CT and per my review and interpretation of labs is no significant anemia or electrolyte abnormality or kidney injury.  Head CT is unremarkable.  Will get MRI of the brain and chest to  further evaluate and discuss with neurology.  Overall MRI is negative for stroke.  MRI of the chest will get an official read over the next 24 hours after talking with radiology.  Patient understands this and he will follow-up as reported on his MyChart and I will follow it up in case there  is anything emergent on that but suspect that should be unremarkable as his chest x-ray had no evidence of mass.  I have no concern for other acute cardiac or pulmonary process at this time.  Overall possible that this could be myasthenia gravis or some other neurologic process but at this time he can continue outpatient follow-up with neurology.  Patient discharged in good condition.  Recommend that he follow-up with his ophthalmologist as well.  Prelim read of MRI of the chest is unremarkable per radiologist on the phone.  There will be definitive/confirmatory read in the morning.  This chart was dictated using voice recognition software.  Despite best efforts to proofread,  errors can occur which can change the documentation meaning.         Final Clinical Impression(s) / ED Diagnoses Final diagnoses:  Diplopia    Rx / DC Orders ED Discharge Orders          Ordered    Ambulatory referral to Neurology       Comments: An appointment is requested in approximately: 1 week   04/24/22 2000              Lennice Sites, DO 04/24/22 2025    Lennice Sites, DO 04/24/22 2033

## 2022-04-24 NOTE — Discharge Instructions (Addendum)
Follow-up with neurology outpatient.  They should call you with an appointment but I have provided you with the phone number to call and confirm appointment.

## 2022-04-24 NOTE — ED Notes (Signed)
Dc instructions reviewed with pt. Will follow up with neuro. No questions or concerns at thsi time. Ambulated out of ED with wife at side.

## 2022-04-24 NOTE — ED Notes (Signed)
Patient transported to MRI 

## 2022-04-24 NOTE — ED Notes (Signed)
Pt still in MRI 

## 2022-04-24 NOTE — ED Notes (Signed)
Pt remains in MRI 

## 2022-04-24 NOTE — ED Triage Notes (Addendum)
Has a hx of double vision but yesterday it got worse around 11 and it stayed where usually it goes away on its own.  Went to ophthalmology and was sent here for MRI to rule out stroke. Ophthalmology wants to rule out MS mysthenia gravida and other things that are causing his hoarseness.

## 2022-04-26 LAB — ACETYLCHOLINE RECEPTOR, BINDING: Acety choline binding ab: <0.03 nmol/L (ref 0.00–0.24)

## 2022-04-26 LAB — STRIATED MUSCLE ANTIBODY: Anti-striation Abs: NEGATIVE

## 2022-04-27 ENCOUNTER — Ambulatory Visit: Payer: PPO | Admitting: Neurology

## 2022-04-27 ENCOUNTER — Encounter: Payer: Self-pay | Admitting: Neurology

## 2022-04-27 VITALS — BP 123/68 | HR 69 | Ht 71.0 in | Wt 207.0 lb

## 2022-04-27 DIAGNOSIS — R070 Pain in throat: Secondary | ICD-10-CM

## 2022-04-27 DIAGNOSIS — H02401 Unspecified ptosis of right eyelid: Secondary | ICD-10-CM | POA: Diagnosis not present

## 2022-04-27 DIAGNOSIS — H4901 Third [oculomotor] nerve palsy, right eye: Secondary | ICD-10-CM | POA: Diagnosis not present

## 2022-04-27 DIAGNOSIS — R918 Other nonspecific abnormal finding of lung field: Secondary | ICD-10-CM | POA: Diagnosis not present

## 2022-04-27 DIAGNOSIS — H5702 Anisocoria: Secondary | ICD-10-CM | POA: Diagnosis not present

## 2022-04-27 DIAGNOSIS — G360 Neuromyelitis optica [Devic]: Secondary | ICD-10-CM

## 2022-04-27 DIAGNOSIS — I7771 Dissection of carotid artery: Secondary | ICD-10-CM

## 2022-04-27 DIAGNOSIS — G902 Horner's syndrome: Secondary | ICD-10-CM | POA: Diagnosis not present

## 2022-04-27 DIAGNOSIS — H4921 Sixth [abducent] nerve palsy, right eye: Secondary | ICD-10-CM

## 2022-04-27 DIAGNOSIS — D496 Neoplasm of unspecified behavior of brain: Secondary | ICD-10-CM

## 2022-04-27 DIAGNOSIS — H4911 Fourth [trochlear] nerve palsy, right eye: Secondary | ICD-10-CM

## 2022-04-27 DIAGNOSIS — R490 Dysphonia: Secondary | ICD-10-CM

## 2022-04-27 DIAGNOSIS — H499 Unspecified paralytic strabismus: Secondary | ICD-10-CM

## 2022-04-27 NOTE — Patient Instructions (Addendum)
CT Angiogram head and neck MRI of the orbits w/wo CT Chest? Call radiologist Consider: MRI cervical spine and brachial plexus after above workup ENT - Hoarseness - Dr. Benjamine Mola

## 2022-04-27 NOTE — Progress Notes (Addendum)
[facility]    Provider:  Dr Jaynee Eagles Requesting Provider: Lennice Sites, DO Primary Care Provider:  Crist Infante, MD  CC:  diplopia  New chief complaint 04/27/2022: Here with his wife, started 5-6 years ago would last briefly, would be vertical double vision, closing one eye made it go away, would happen possibly with tired, seemed to go away on its own, 1-2x a month and then he saw Dr. Katy Fitch and he couldn't explin it but wasn't having the episodes, This episode was unusual, hadn't had any episodes in 3-4 months, on Friday morning he had taken his car somewhere and he felt it going on, this time was horizontal with a slight skew worse when looked to the left. Right ptosis and right pupil was slightly smaller. Lasted about 50 hours. Today he is asymptomatic. Started Friday at 1am and was better around Sunday slowly resolved and as it began to go away could still see double vision one next to the other when looking to the left but looking straight on it was ok and looking to the right the double vision had gone away as well. No head pain, no jaw pain, if he closes each eye he was fine.    Per Dr. Payton Emerald ophthalmology notes, patient was seen 04/24/2022, double vision that started 11 AM in the morning it was constant resolved by closing 1 eye he has had a history of diplopia in the past 5 to 6 years ago transient episodes lasting for seconds to 5 minutes.  Both images are up-and-down and side-by-side.  He has a history of factor V Leiden and is on Eliquis.  Examination showed no lid fatigability, right pupil in the dark was 2.5 mm left pupil in the dark was 3 mm, right pupil in the light was 2 mm, left pupil in the light was 2 mm.  No APD.  Right hypertropia with exotropia worse in left gaze and downgaze.  Would be more associated with Horner's but would not explain the diplopia.  Dr. Herbert Deaner recommended going to the emergency room and getting an MRI of the chest, MRI of the brain,  acetylcholine receptor antibodies for myasthenia gravis.  Dr. Herbert Deaner noted does not completely fit a 3rd nerve palsy, superior oblique or 6th nerve palsy, does have anisocoria worse in the dark with right upper eyelid ptosis.  There is a need to rule out Horner syndrome.  PMHx hypertension, erectile dysfunction, DVT, back pain with radiculopathy, b12 deficiency, alcohol use nightly, spondylolisthesis of the lumbar region, erythema chronicum migrans, factor V Leiden mutation, sciatic pain, alpha galactosidase deficiency, spinal stenosis with surgery in 2020, Lyme testing was negative in 2019, also was tested for [facility] spotted fever, had an ultrasound compression venography of the lower extremities, ultrasound showed DVT in 4 veins of the left leg lower thigh and below the knee August 2019 spontaneous unprovoked, MRI confirmed spinal stenosis at L4-L5, CKD GFR 59.9, has seen Dr. Arnoldo Morale at [facility] for low back surgery decompression and fusion L4-L5 and bone fusion in 2020.  Reviewed notes, labs and imaging from outside physicians, which showed:  I reviewed notes from the emergency room April 24, 2022, he was sent from ophthalmology for stroke workup, he has a history of double vision in his eyes in the last several years but has been having more double vision since the prior day, now mostly improved, resolves when he closes 1 eye, with both eyes open when he looks at a distance he sees double  when he closes 1 eye gets better, he has a history of factor V Leiden and is on Eliquis, he has some drooping of his right eyelid yesterday that got better today, ophthalmologist thought he needed to get a stroke workup done with an MRI and there was a concern there may be myasthenia gravis and he was told his left pupil was bigger than his right pupil.  He had ptosis of the right eye and possibly his left pupil mildly bigger than the right but the ED did not notice any major ptosis or visual field  deficits or any double vision currently.  MRI was negative for stroke.  MRI of the chest was also ordered.  CT chest: IMPRESSION: 1. No mediastinal mass or mediastinal lymphadenopathy. 2. Possible small right infrahilar consolidation or lesion. CT chest without contrast recommended to further evaluate.  Brain: There is no evidence of an acute infarct, intracranial hemorrhage, mass, midline shift, or extra-axial fluid collection. Mild cerebral atrophy is within normal limits for age. Small T2 hyperintensities in the cerebral white matter bilaterally are nonspecific but compatible with minimal chronic small vessel ischemic disease. There is a chronic lacunar infarct in the left caudate head. No abnormal enhancement is identified.   Vascular: Major intracranial vascular flow voids are preserved.   Skull and upper cervical spine: Unremarkable bone marrow signal.   Sinuses/Orbits: Unremarkable orbits. Mild mucosal thickening in the paranasal sinuses. Mucous retention cyst in the right maxillary sinus. No significant mastoid fluid.   Other: None.   IMPRESSION: 1. No acute intracranial abnormality. 2. Minimal chronic small vessel ischemic disease.  Achr antibodies negative.   HPI 04/29/2021:  Steven Hartman is a 77 y.o. male here as requested by Lennice Sites, DO for peripheral polyneuropathy. PMHx hypertension, erectile dysfunction, DVT, back pain with radiculopathy, spondylolisthesis of the lumbar region, erythema chronicum migrans, factor V Leiden mutation, sciatic pain, alpha galactosidase deficiency, spinal stenosis with surgery in 2020, Lyme testing was negative in 2019, also was tested for [facility] spotted fever, had an ultrasound compression venography of the lower extremities, ultrasound showed DVT in 4 veins of the left leg lower thigh and below the knee August 2019 spontaneous unprovoked, MRI confirmed spinal stenosis at L4-L5, CKD GFR 59.9, has seen Dr. Arnoldo Morale at  [facility] [facility] for low back surgery decompression and fusion L4-L5 and bone fusion in 2020.  I reviewed Dr. Tempie Donning notes, he drinks alcohol every day, he drinks 4 or more drinks a week, drinks wine, average 2 drinks per day, never smoker, he has tingling and numbness, mild neuropathy not changing.  Per Dr. Haynes Kerns, he needs a basic initial neuropathy evaluation possibly nerve conduction study as clinically warranted.  Patient is here alone and reports He has had back issus since 2008. He had surgery for stenosis in 2020 and since then he has weakness of the left > right leg. After surgery the weakness was more obvious. Before the surgery he had bilateral sciatica and that was improved after surgery. Prior to surgery he had  epidural steroid injections in 2018 and 2019 and when the sciatica became worse he had decompression surgery. He feels he has strong leg muscles at the gym, he can't o a toe raise with the left foot, a foot drop, but the left was worse. The pain improved after the surgery. The left leg weakness improved a little bit but never came back to baseline and the left was the worst. The right leg is overall ok, not like the  left leg. The weakness has been stable or slightly improved since 2020. The bottom of his feet feel strange sometimes like little sponges but no numbness or tingling in the toes or feet. No numbness in the hands, no weakness int he arms, no radicular cervical psin. No other focal neurologic deficits, associated symptoms, inciting events or modifiable factors.  Reviewed notes, labs and imaging from outside physicians, which showed:  Labs collected February 24, 2019 to include CMP with creatinine 1.1, BUN 20 otherwise normal, CBC unremarkable, TSH normal, vitamin D 46.5.  MRI lumbar spine 2008 (reviewed images with patient and agree: Clinical Data:  Bilateral buttock and leg pain for five months, worsening with activity.  No acute injury or prior back surgery.  MRI  LUMBAR SPINE WITHOUT CONTRAST:  Technique:  Multiplanar and multiecho pulse sequences of the lumbar spine, to include the lower thoracic and upper sacral regions, were obtained according to standard protocol without IV contrast.  Comparison:  Lumbar spine radiographs 05/09/06.  Findings:  There are five lumbar type vertebral bodies in anatomic alignment.  There is no evidence of fracture or pars defect. The lumbar pedicles are relatively short on a congenital basis.  The conus medullaris extends to the mid L1 level and appears normal.  There are no paraspinal abnormalities.    Sagittal images demonstrate the T11-12 and T12-L1 discs to be normal.    L1-2:  Mild disc bulge.  L2-3:  Mild disc bulge.    L3-4:  Disc bulging and slight thickening of the ligamentum flavum result in borderline central and mild left lateral recess stenosis.  There is no foraminal compromise or definite nerve root encroachment.  L4-5:  There is moderate annular disc bulging with advanced bilateral facet hypertrophy, right greater than left.  There is a synovial cyst extending medially from the right facet joint and contributing to right L5 nerve root compression. The ligamentum flava are thickened bilaterally, and there is underlying severe central and lateral recess stenosis bilaterally.  The neural foramina are mildly narrowed bilaterally without definite L4 nerve root encroachment.  L5-S1:  Minimal disc bulge.  Mild bilateral facet hypertrophy.  The left lateral recess is mildly narrowed without definite nerve root encroachment.  The foramina appear sufficiently patent.  IMPRESSION:  1.  The most significant findings are at L4-5 where there is moderate to severe multifactorial spinal stenosis due to annular disc bulging and bilateral facet hypertrophy.  A right sided synovial cyst contributes to right lateral recess stenosis and right L5 nerve root compression.  2.  Mild left lateral recess stenosis at both L3-4 and L5-S1  secondary to asymmetric facet hypertrophy.  The spinal canal appears adequately patent at those levels, and there is no foraminal compromise.     Review of Systems: Patient complains of symptoms per HPI as well as the following symptoms weakness. Pertinent negatives and positives per HPI. All others negative.   Social History   Socioeconomic History   Marital status: Married    Spouse name: Not on file   Number of children: Not on file   Years of education: Not on file   Highest education level: Not on file  Occupational History   Not on file  Tobacco Use   Smoking status: Never   Smokeless tobacco: Never  Vaping Use   Vaping Use: Never used  Substance and Sexual Activity   Alcohol use: Not Currently    Alcohol/week: 6.0 standard drinks of alcohol    Types: 6 Glasses of wine  per week    Comment: Wine   Drug use: Never   Sexual activity: Yes    Partners: Female  Other Topics Concern   Not on file  Social History Narrative   6-7 hours of sleep a night 2 people living in the home Drinks caffienated beverages Exercise at least 3 times a weekTakes 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       Caffeine: 2 cups/day of coffee   Right handed   Social Determinants of Health   Financial Resource Strain: Not on file  Food Insecurity: Not on file  Transportation Needs: Not on file  Physical Activity: Not on file  Stress: Not on file  Social Connections: Not on file  Intimate Partner Violence: Not on file    Family History  Problem Relation Age of Onset   Parkinson's disease Mother 46   Arthritis Father    Rheum arthritis Father    Rheum arthritis Sister    Colon polyps Brother    Prostate cancer Brother    Colon cancer Neg Hx    Neuropathy Neg Hx     Past Medical History:  Diagnosis Date   Allergy to alpha-gal    result of a tick bite   Factor 5 Leiden mutation, heterozygous ([place])    GERD  (gastroesophageal reflux disease)    History of chicken pox    History of colon polyps    Hypertension    Sciatic pain    Synovial cyst of lumbar spine     Patient Active Problem List   Diagnosis Date Noted   Left leg weakness 04/29/2021   Decreased sensation of foot 04/29/2021   Spondylolisthesis, lumbar region 03/22/2018   Acute deep vein thrombosis (DVT) of left lower extremity ([place]) 11/11/2017   Allergy to alpha-gal 11/10/2017   Vitamin D deficiency 08/25/2017   ECM (erythema chronicum migrans) 08/24/2017   Need for hepatitis C screening test 08/24/2017   Erectile dysfunction due to arterial insufficiency 08/24/2017   Essential hypertension 07/11/2014   Routine general medical examination at a health care facility 04/24/2013   Personal history of colonic polyps 09/30/2009   BACK PAIN WITH RADICULOPATHY 05/19/2007    Past Surgical History:  Procedure Laterality Date   BACK SURGERY  2020   lumbar spine   COLONOSCOPY  multiple   TONSILLECTOMY AND ADENOIDECTOMY  03/16/1971    Current Outpatient Medications  Medication Sig Dispense Refill   apixaban (ELIQUIS) 2.5 MG TABS tablet Take 2.5 mg by mouth 2 (two) times daily.     Cyanocobalamin 2500 MCG SUBL Place 1 tablet under the tongue daily.     EPINEPHrine 0.3 mg/0.3 mL IJ SOAJ injection      ezetimibe (ZETIA) 10 MG tablet      fexofenadine (ALLEGRA) 180 MG tablet Take 180 mg by mouth daily as needed for allergies or rhinitis.     MICARDIS 80 MG tablet TAKE 1 TABLET BY MOUTH DAILY (Patient taking differently: at bedtime.) 90 tablet 0   Multiple Vitamins-Minerals (MULTIVITAMIN PO) Take 1 tablet by mouth daily.     rosuvastatin (CRESTOR) 20 MG tablet Take 20 mg by mouth at bedtime.     sildenafil (REVATIO) 20 MG tablet Take 4 tablets (80 mg total) by mouth daily as needed. (Patient taking differently: Take 80 mg by mouth daily as needed (for ED).) 60 tablet 11   triamcinolone (NASACORT) 55 MCG/ACT AERO nasal inhaler Place 2  sprays into the nose daily as  needed (for allergies).      VITAMIN D, CHOLECALCIFEROL, PO Take 5,000 Units by mouth daily.     No current facility-administered medications for this visit.    Allergies as of 04/27/2022 - Review Complete 04/27/2022  Allergen Reaction Noted   Gelatin Other (See Comments) 03/21/2018   Meat [alpha-gal] Hives, Rash, and Other (See Comments) 03/13/2018    Vitals: BP 123/68   Pulse 69   Ht 5' 11"$  (1.803 m)   Wt 207 lb (93.9 kg)   BMI 28.87 kg/m  Last Weight:  Wt Readings from Last 1 Encounters:  04/27/22 207 lb (93.9 kg)   Last Height:   Ht Readings from Last 1 Encounters:  04/27/22 5' 11"$  (1.803 m)     Physical exam: Exam: Gen: NAD, conversant, well nourised, well groomed                     CV: RRR, no MRG. No Carotid Bruits. No peripheral edema, warm, nontender Eyes: Conjunctivae clear without exudates or hemorrhage  Neuro: Detailed Neurologic Exam  Speech:    Speech is normal; fluent and spontaneous with normal comprehension.  Cognition:    The patient is oriented to person, place, and time;     recent and remote memory intact;     language fluent;     normal attention, concentration,     fund of knowledge Cranial Nerves:   Right pupil 79m smalller in light but equally reactive.  The pupils are equal, round, and reactive to light. The fundi are flat Visual fields are full to finger confrontation. Extraocular movements are intact. Trigeminal sensation is intact and the muscles of mastication are normal. Right ptosis. The palate elevates in the midline. Hearing intact. Voice is normal. Shoulder shrug is normal. The tongue has normal motion without fasciculations.   Coordination:    Normal    Gait:    normal.   Motor Observation:    No asymmetry, no atrophy, and no involuntary movements noted. Tone:    Normal muscle tone.    Posture:    Posture is normal. normal erect    Strength: 4-/5 left hip flexion and left toe extension.  Otherwise strength is V/V in the upper and lower limbs.      Sensation: decrease to pin prick and temp slightly distally, vibration a few seconds at the proximal joint, proprioception is intact.      Reflex Exam:  DTR's:    Absent AJs Brisk patellars left >> right Brisk uppers lly.   Toes:    The toes are downgoing bilaterally.   Clonus:    Clonus is absent.    Assessment/Plan: Patient here for Right opthalmoplegia with ptosis and miosis. MHx hypertension, erectile dysfunction, DVT, back pain with radiculopathy, b12 deficiency, alcohol use nightly, spondylolisthesis of the lumbar region, erythema chronicum migrans, factor V Leiden mutation, sciatic pain, alpha galactosidase deficiency, spinal stenosis with surgery in 2020, Lyme testing was negative in 2019, also [facility], had an ultrasound compression venography of the lower extremities, ultrasound showed DVT in 4 veins of the left leg lower thigh and below the knee August 2019 spontaneous unprovoked, MRI confirmed spinal stenosis at L4-L5, CKD GFR 59.9, [facility] decompression and fusion L4-L5 and bone fusion in 2020.B12 deficiency  Right opthalmoplegia with ptosis and miosis. Sounds more like horner's but the binocular diplopia and ophthalmoplegia(3,6,superior oblique) doesn't fit.  2.  Atypical  for ocular myasthenia gravis and So far acetylcoline antibodies negative still pending; however antibodies are often negative in our ocular MG. If it is pure ocular MG and he has had it 5-6 years unlikely to generalize would just monitor.  3.    MRI chest showed No mediastinal mass or mediastinal lymphadenopathy, Possible small right infrahilar consolidation or lesion and CT chest without contrast recommended to further evaluate.   To evaluate for horner syndrome : would also add in CTA H&N, if negative other less common causes of horner syndrome could be in  the cervical spine(1st order: cervical tumor, demyelination; 2nd order:syringomyelia, transverse myelitis, cervicaal cord infarction) or brachial plexus injury so could also repeat MRI chest focusing on the brachial plexus.  Cavernous sinus pathology: He had cranial nerve 3,4, and 6 palsy all of which traverse the cavernous sinus, need MRI orbits.  - Hoarseness  and throat pain - will send to ENT. H&N cancer? Paraneoplastic sequelae?  - Jimmye Norman hunt?   Orders Placed This Encounter  Procedures   MR ORBITS W WO CONTRAST   CT ANGIO HEAD W OR WO CONTRAST   CT ANGIO NECK W OR WO CONTRAST   CT CHEST WO CONTRAST   Sedimentation rate   C-reactive protein   Acetylcholine receptor, blocking   Ambulatory referral to ENT    PRIOR ASSESSMENT AND PLAN:   This is a very nice 77 year old patient who has had chronic back pain and degenerative disease since at least 2008 who reports residual left leg weakness after L4-L5 decompression, instrumentation and fusion in 2020 due to lumbar spinal stenosis with neurogenic claudication and lumbar radiculopathy.  -He has residual very mild left hip flexion and toe extension weakness.  He reports this has improved since surgery.  He has no new symptoms.  I believe this is residual from his low back issues and given that he does not have any new symptoms and he is actually improved since surgery I do not think an EMG nerve conduction study would provide [place] with any additional information.  -On examination I did notice a decrease distally in the feet to pinprick, temperature, vibration consistent with a very mild peripheral neuropathy.  Risk factors b12 deficiency 04/2021 226 and possibly etoh, it does not appear to bother him or be progressive and could be partially residual from his radiculopathy status post surgery.  I will order several blood tests and monitor clinically.  He does drink 2 glasses of wine a night and I question whether long-term alcohol use, even if  it is within the accepted guidelines, can sometimes cause a mild distal peripheral polyneuropathy.   - prior B12 deficiency 04/29/2021 226 1 year ago, 08/25/2021 was corrected - B6, b1, hgba1c 04/29/2021 nml -    - RTC as needed.  If he has new or worsening symptoms we can order an EMG nerve conduction study and I did discuss this with him.  He can contact me through [place] as needed.  Orders Placed This Encounter  Procedures   MR ORBITS W WO CONTRAST   CT ANGIO HEAD W OR WO CONTRAST   CT ANGIO NECK W OR WO CONTRAST   CT CHEST WO CONTRAST   Sedimentation rate   C-reactive protein   Acetylcholine receptor, blocking   Ambulatory referral to ENT   No orders of the defined types were placed in this encounter.   Cc: Lennice Sites, DO,  Crist Infante, MD  Sarina Ill, MD  [facility] [address]  [place], [place] [address]  Phone [phone] Fax [phone] I spent over 85 minutes of face-to-face and non-face-to-face time with patient on the  1. Ophthalmoplegia   2. Ptosis of right eyelid   3. Anisocoria   4. Right oculomotor nerve palsy   5. Right abducens nerve palsy   6. Cranial nerve IV palsy, right   7. Cavernous sinus tumor ([place])   8. Acquired right-sided Horner syndrome   9. Dissection of right carotid artery ([place])   10. Hoarseness   11. Throat pain   12. Lung mass    diagnosis.  This included previsit chart review, lab review, study review, order entry, electronic health record documentation, patient education on the different diagnostic and therapeutic options, counseling and coordination of care, risks and benefits of management, compliance, or risk factor reduction

## 2022-04-29 LAB — ACETYLCHOLINE RECEPTOR, BLOCKING: Acetylchol Block Ab: 16 % (ref 0–25)

## 2022-04-29 LAB — SEDIMENTATION RATE: Sed Rate: 2 mm/hr (ref 0–30)

## 2022-04-29 LAB — C-REACTIVE PROTEIN: CRP: <1 mg/L (ref 0–10)

## 2022-04-30 ENCOUNTER — Encounter: Payer: Self-pay | Admitting: Neurology

## 2022-04-30 NOTE — Addendum Note (Signed)
Addended by: Sarina Ill B on: 04/30/2022 01:33 PM   Modules accepted: Orders

## 2022-05-01 LAB — MUSK ANTIBODIES: MuSK Antibodies: <1 U/mL

## 2022-05-03 ENCOUNTER — Telehealth: Payer: Self-pay | Admitting: Neurology

## 2022-05-03 DIAGNOSIS — R490 Dysphonia: Secondary | ICD-10-CM

## 2022-05-03 DIAGNOSIS — R070 Pain in throat: Secondary | ICD-10-CM

## 2022-05-03 NOTE — Telephone Encounter (Signed)
They sent a message back that Dr. Benjamine Mola only sees patients for ears and nose, no longer sees patients with throat/mouth issues.

## 2022-05-03 NOTE — Telephone Encounter (Signed)
Referral for ENT fax to Dr. Leta Baptist. Phone: [phone], Fax: [phone]

## 2022-05-04 ENCOUNTER — Telehealth: Payer: Self-pay | Admitting: Neurology

## 2022-05-04 NOTE — Telephone Encounter (Signed)
[employer] [facility] sent to GI [phone]

## 2022-05-06 NOTE — Addendum Note (Signed)
Addended by: Gildardo Griffes on: 05/06/2022 04:40 PM   Modules accepted: Orders

## 2022-05-06 NOTE — Telephone Encounter (Signed)
Could you create another referral due to original referral was assigned to Dr. Benjamine Mola?

## 2022-05-07 ENCOUNTER — Ambulatory Visit
Admission: RE | Admit: 2022-05-07 | Discharge: 2022-05-07 | Disposition: A | Discharge status: Home / Self Care | Payer: PPO | Source: Ambulatory Visit | Attending: Neurology | Admitting: Neurology

## 2022-05-07 DIAGNOSIS — I2510 Atherosclerotic heart disease of native coronary artery without angina pectoris: Secondary | ICD-10-CM | POA: Diagnosis not present

## 2022-05-07 DIAGNOSIS — H4930 Total (external) ophthalmoplegia, unspecified eye: Secondary | ICD-10-CM | POA: Diagnosis not present

## 2022-05-07 DIAGNOSIS — E785 Hyperlipidemia, unspecified: Secondary | ICD-10-CM | POA: Diagnosis not present

## 2022-05-07 DIAGNOSIS — G629 Polyneuropathy, unspecified: Secondary | ICD-10-CM | POA: Diagnosis not present

## 2022-05-07 DIAGNOSIS — H4901 Third [oculomotor] nerve palsy, right eye: Secondary | ICD-10-CM

## 2022-05-07 DIAGNOSIS — H499 Unspecified paralytic strabismus: Secondary | ICD-10-CM

## 2022-05-07 DIAGNOSIS — I824Y9 Acute embolism and thrombosis of unspecified deep veins of unspecified proximal lower extremity: Secondary | ICD-10-CM | POA: Diagnosis not present

## 2022-05-07 DIAGNOSIS — D496 Neoplasm of unspecified behavior of brain: Secondary | ICD-10-CM

## 2022-05-07 DIAGNOSIS — I700 Atherosclerosis of aorta: Secondary | ICD-10-CM | POA: Diagnosis not present

## 2022-05-07 DIAGNOSIS — H4911 Fourth [trochlear] nerve palsy, right eye: Secondary | ICD-10-CM

## 2022-05-07 DIAGNOSIS — I10 Essential (primary) hypertension: Secondary | ICD-10-CM | POA: Diagnosis not present

## 2022-05-07 DIAGNOSIS — H4921 Sixth [abducent] nerve palsy, right eye: Secondary | ICD-10-CM

## 2022-05-07 DIAGNOSIS — H5702 Anisocoria: Secondary | ICD-10-CM

## 2022-05-07 DIAGNOSIS — D6851 Activated protein C resistance: Secondary | ICD-10-CM | POA: Diagnosis not present

## 2022-05-07 DIAGNOSIS — H02401 Unspecified ptosis of right eyelid: Secondary | ICD-10-CM | POA: Diagnosis not present

## 2022-05-07 DIAGNOSIS — H532 Diplopia: Secondary | ICD-10-CM | POA: Diagnosis not present

## 2022-05-07 DIAGNOSIS — I129 Hypertensive chronic kidney disease with stage 1 through stage 4 chronic kidney disease, or unspecified chronic kidney disease: Secondary | ICD-10-CM | POA: Diagnosis not present

## 2022-05-07 MED ORDER — GADOPICLENOL 0.5 MMOL/ML IV SOLN
10.0000 mL | Freq: Once | INTRAVENOUS | Status: AC | PRN
  Administered 2022-05-07: 9 mL via INTRAVENOUS

## 2022-05-11 DIAGNOSIS — H532 Diplopia: Secondary | ICD-10-CM | POA: Diagnosis not present

## 2022-05-12 NOTE — Telephone Encounter (Signed)
Referral resent for ENT to [facility]. Phone: [phone], Fax: [phone].

## 2022-05-13 ENCOUNTER — Ambulatory Visit
Admission: RE | Admit: 2022-05-13 | Discharge: 2022-05-13 | Disposition: A | Discharge status: Home / Self Care | Payer: PPO | Source: Ambulatory Visit | Attending: Neurology | Admitting: Neurology

## 2022-05-13 DIAGNOSIS — H02401 Unspecified ptosis of right eyelid: Secondary | ICD-10-CM

## 2022-05-13 DIAGNOSIS — H499 Unspecified paralytic strabismus: Secondary | ICD-10-CM

## 2022-05-13 DIAGNOSIS — D496 Neoplasm of unspecified behavior of brain: Secondary | ICD-10-CM

## 2022-05-13 DIAGNOSIS — R918 Other nonspecific abnormal finding of lung field: Secondary | ICD-10-CM | POA: Diagnosis not present

## 2022-05-13 DIAGNOSIS — I7771 Dissection of carotid artery: Secondary | ICD-10-CM

## 2022-05-13 DIAGNOSIS — I6503 Occlusion and stenosis of bilateral vertebral arteries: Secondary | ICD-10-CM | POA: Diagnosis not present

## 2022-05-13 DIAGNOSIS — I700 Atherosclerosis of aorta: Secondary | ICD-10-CM | POA: Diagnosis not present

## 2022-05-13 DIAGNOSIS — H4921 Sixth [abducent] nerve palsy, right eye: Secondary | ICD-10-CM

## 2022-05-13 DIAGNOSIS — R911 Solitary pulmonary nodule: Secondary | ICD-10-CM | POA: Diagnosis not present

## 2022-05-13 DIAGNOSIS — H532 Diplopia: Secondary | ICD-10-CM | POA: Diagnosis not present

## 2022-05-13 DIAGNOSIS — H4911 Fourth [trochlear] nerve palsy, right eye: Secondary | ICD-10-CM

## 2022-05-13 DIAGNOSIS — I672 Cerebral atherosclerosis: Secondary | ICD-10-CM | POA: Diagnosis not present

## 2022-05-13 DIAGNOSIS — G902 Horner's syndrome: Secondary | ICD-10-CM

## 2022-05-13 DIAGNOSIS — I6522 Occlusion and stenosis of left carotid artery: Secondary | ICD-10-CM | POA: Diagnosis not present

## 2022-05-13 DIAGNOSIS — H4901 Third [oculomotor] nerve palsy, right eye: Secondary | ICD-10-CM

## 2022-05-13 DIAGNOSIS — H5702 Anisocoria: Secondary | ICD-10-CM

## 2022-05-13 MED ORDER — IOPAMIDOL (ISOVUE-370) INJECTION 76%
75.0000 mL | Freq: Once | INTRAVENOUS | Status: AC | PRN
  Administered 2022-05-13: 75 mL via INTRAVENOUS

## 2022-05-16 ENCOUNTER — Other Ambulatory Visit: Payer: PPO

## 2022-05-18 ENCOUNTER — Telehealth: Payer: Self-pay | Admitting: Neurology

## 2022-05-18 NOTE — Telephone Encounter (Signed)
Dr. Joylene Draft: I checked a CT of his chest for thymoma. No thymoma to explain what happened or suggest myasthenia gravis but we did see some nodules that may need followin gup, I'll leave that in your hands fyi thanks:  Multiple small lung nodules. Those in the right lower lobe and middle lobe were stable going back to a study of January 2020. Those in the left lower lobe including a nodule measuring up to 8 mm wasnot seen on the priors. Non-contrast chest CT at 3-6 months isrecommended. If the nodules are stable at time of repeat CT, thenfuture CT at 18-24 months (from today's scan) is considered optionalfor low-risk patients, but is recommended for high-risk patients.   Pod 4 : will you fax a copy of the CT chest to Dr. Silvestre Mesi office with my note above on the cover sheet? thanks

## 2022-05-19 NOTE — Telephone Encounter (Signed)
Faxed to [phone] (received confirmation).

## 2022-05-20 ENCOUNTER — Telehealth: Payer: Self-pay | Admitting: *Deleted

## 2022-05-20 NOTE — Telephone Encounter (Signed)
LMVM for pt that was following up on the CTA results and also offering order for MRI cspine.  Pt to relturn call to [place] when gets message.

## 2022-05-20 NOTE — Telephone Encounter (Signed)
-----   Message from Melvenia Beam, MD sent at 05/18/2022  3:06 PM EST ----- Please call him (see at the end, I'm offering him an MRI c-spine): No significant stenosis in any of your arteries. One of the arteries has some atherosclerosis but it would not account for your symptoms and at 81 I would expect a little atherosclerosis so your arteries look great. I would watch your cholesterol however. Good news that your blood vessels look godd but still don;t know why you had your symptoms I'm sorry! If your symptoms happen again I would video tape them and we should send you to a neuro-ophthalmologist at [facility] or [facility]. There is a rare cause that could be in your cervical spine and I am happy to order an MRI of your cervical spine just to make sure if you are willing - I will ask my team to call you thanks Dr. Jaynee Eagles

## 2022-05-22 ENCOUNTER — Encounter: Payer: Self-pay | Admitting: Neurology

## 2022-05-24 NOTE — Telephone Encounter (Signed)
See mychart email stream.  This ws addressed there.

## 2022-05-25 ENCOUNTER — Other Ambulatory Visit: Payer: Self-pay | Admitting: Neurology

## 2022-05-25 ENCOUNTER — Telehealth: Payer: Self-pay | Admitting: Neurology

## 2022-05-25 NOTE — Addendum Note (Signed)
Addended by: Sarina Ill B on: 05/25/2022 02:20 PM   Modules accepted: Orders

## 2022-05-25 NOTE — Telephone Encounter (Signed)
[place]alt[place] adv NPR sent to GI [phone] 

## 2022-05-27 ENCOUNTER — Other Ambulatory Visit: Payer: Self-pay | Admitting: Internal Medicine

## 2022-05-27 DIAGNOSIS — R911 Solitary pulmonary nodule: Secondary | ICD-10-CM

## 2022-06-02 ENCOUNTER — Other Ambulatory Visit: Payer: PPO

## 2022-06-10 DIAGNOSIS — Z86718 Personal history of other venous thrombosis and embolism: Secondary | ICD-10-CM | POA: Diagnosis not present

## 2022-06-10 DIAGNOSIS — D682 Hereditary deficiency of other clotting factors: Secondary | ICD-10-CM | POA: Diagnosis not present

## 2022-06-10 DIAGNOSIS — G8929 Other chronic pain: Secondary | ICD-10-CM | POA: Diagnosis not present

## 2022-06-10 DIAGNOSIS — M5136 Other intervertebral disc degeneration, lumbar region: Secondary | ICD-10-CM | POA: Diagnosis not present

## 2022-06-10 DIAGNOSIS — E663 Overweight: Secondary | ICD-10-CM | POA: Diagnosis not present

## 2022-06-10 DIAGNOSIS — D6851 Activated protein C resistance: Secondary | ICD-10-CM | POA: Diagnosis not present

## 2022-06-10 DIAGNOSIS — E538 Deficiency of other specified B group vitamins: Secondary | ICD-10-CM | POA: Diagnosis not present

## 2022-06-10 DIAGNOSIS — N529 Male erectile dysfunction, unspecified: Secondary | ICD-10-CM | POA: Diagnosis not present

## 2022-06-10 DIAGNOSIS — E785 Hyperlipidemia, unspecified: Secondary | ICD-10-CM | POA: Diagnosis not present

## 2022-06-10 DIAGNOSIS — I10 Essential (primary) hypertension: Secondary | ICD-10-CM | POA: Diagnosis not present

## 2022-06-17 ENCOUNTER — Ambulatory Visit
Admission: RE | Admit: 2022-06-17 | Discharge: 2022-06-17 | Disposition: A | Discharge status: Home / Self Care | Payer: PPO | Source: Ambulatory Visit | Attending: Neurology | Admitting: Neurology

## 2022-06-17 DIAGNOSIS — H4901 Third [oculomotor] nerve palsy, right eye: Secondary | ICD-10-CM

## 2022-06-17 DIAGNOSIS — H4921 Sixth [abducent] nerve palsy, right eye: Secondary | ICD-10-CM

## 2022-06-17 DIAGNOSIS — G902 Horner's syndrome: Secondary | ICD-10-CM

## 2022-06-17 DIAGNOSIS — H499 Unspecified paralytic strabismus: Secondary | ICD-10-CM | POA: Diagnosis not present

## 2022-06-17 DIAGNOSIS — G360 Neuromyelitis optica [Devic]: Secondary | ICD-10-CM

## 2022-06-17 DIAGNOSIS — I7771 Dissection of carotid artery: Secondary | ICD-10-CM

## 2022-06-17 DIAGNOSIS — H5702 Anisocoria: Secondary | ICD-10-CM

## 2022-06-17 DIAGNOSIS — H4911 Fourth [trochlear] nerve palsy, right eye: Secondary | ICD-10-CM

## 2022-06-17 DIAGNOSIS — H02401 Unspecified ptosis of right eyelid: Secondary | ICD-10-CM

## 2022-06-17 MED ORDER — GADOPICLENOL 0.5 MMOL/ML IV SOLN
9.0000 mL | Freq: Once | INTRAVENOUS | Status: AC | PRN
  Administered 2022-06-17: 7.5 mL via INTRAVENOUS

## 2022-06-21 ENCOUNTER — Encounter: Payer: Self-pay | Admitting: Neurology

## 2022-07-28 ENCOUNTER — Ambulatory Visit: Payer: PPO | Admitting: Neurology

## 2022-08-20 ENCOUNTER — Ambulatory Visit (INDEPENDENT_AMBULATORY_CARE_PROVIDER_SITE_OTHER): Payer: PPO | Admitting: Neurology

## 2022-08-20 ENCOUNTER — Encounter: Payer: Self-pay | Admitting: Neurology

## 2022-08-20 VITALS — BP 113/64 | HR 64 | Ht 71.0 in | Wt 206.0 lb

## 2022-08-20 DIAGNOSIS — G7000 Myasthenia gravis without (acute) exacerbation: Secondary | ICD-10-CM | POA: Diagnosis not present

## 2022-08-20 MED ORDER — PYRIDOSTIGMINE BROMIDE 60 MG PO TABS: 60.0000 mg | ORAL_TABLET | Freq: Three times a day (TID) | ORAL | 1 refills | Status: DC

## 2022-08-20 NOTE — Progress Notes (Signed)
[facility]    Provider:  Dr Lucia Gaskins Requesting Provider: [name], MD Primary Care Provider:  [name], MD  CC:  diplopia  08/20/2022: He is doing well, getting another CT scan chest. ENT appointment. Myasthenia gravis labs were neg. Neg thymic imaging. Labs were all unremarkable. No more diplopia. Dr. Dione Booze . May be mild MG mild will I've mestinon to treat when he has it. He has no muscle weakness. DOubtful microvascular ischemia. Extensive imaging did not explain the symptoms. Sometimes feels it coming on, closes eyes for a little bit and resolves. He has gotten a lid droop on the right. But also since he has not generalized in close to 7 years unlikely he will geralize but discussed symptoms to go to the ed. No swallowing problems more dry food situation, he had a hoarse voice and has some reflux. Discussed meds not to take with MG. 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   Patient complains of symptoms per HPI as well as the following symptoms: none . Pertinent negatives and positives per HPI. All others negative   05/18/2022; New chief complaint 04/27/2022: Here with his wife, started 5-6 years ago would last briefly, would be vertical double vision, closing one eye made it go away, would happen possibly with tired, seemed to go away on its own, 1-2x a month and then he saw Dr. Dione Booze and he couldn't explin it but wasn't having the episodes, This episode was unusual, hadn't had any episodes in 3-4 months, on Friday morning he had taken his car somewhere and he felt it going on, this time was horizontal with a slight skew worse when looked to the left. Right ptosis and right pupil was slightly smaller. Lasted about 50 hours. Today he is asymptomatic. Started Friday at 1am and was better around Sunday slowly resolved and as it began to go away could still see double vision one next to the other when looking to the left but looking straight on it was ok and looking to the right the  double vision had gone away as well. No head pain, no jaw pain, if he closes each eye he was fine.    Per Dr. Deirdre Evener ophthalmology notes, patient was seen 04/24/2022, double vision that started 11 AM in the morning it was constant resolved by closing 1 eye he has had a history of diplopia in the past 5 to 6 years ago transient episodes lasting for seconds to 5 minutes.  Both images are up-and-down and side-by-side.  He has a history of factor V Leiden and is on Eliquis.  Examination showed no lid fatigability, right pupil in the dark was 2.5 mm left pupil in the dark was 3 mm, right pupil in the light was 2 mm, left pupil in the light was 2 mm.  No APD.  Right hypertropia with exotropia worse in left gaze and downgaze.  Would be more associated with Horner's but would not explain the diplopia.  Dr. Elmer Picker recommended going to the emergency room and getting an MRI of the chest, MRI of the brain, acetylcholine receptor antibodies for myasthenia gravis.  Dr. Elmer Picker noted does not completely fit a 3rd nerve palsy, superior oblique or 6th nerve palsy, does have anisocoria worse in the dark with right upper eyelid ptosis.  There is a need to rule out Horner syndrome.  PMHx hypertension, erectile dysfunction, DVT, back pain with radiculopathy, b12 deficiency, alcohol use nightly, spondylolisthesis of the lumbar region, erythema chronicum migrans, factor V Leiden mutation, sciatic  pain, alpha galactosidase deficiency, spinal stenosis with surgery in 2020, Lyme testing was negative in 2019, also was tested for [facility] spotted fever, had an ultrasound compression venography of the lower extremities, ultrasound showed DVT in 4 veins of the left leg lower thigh and below the knee August 2019 spontaneous unprovoked, MRI confirmed spinal stenosis at L4-L5, CKD GFR 59.9, has seen Dr. Lovell Sheehan at [facility] neurosurgery for low back surgery decompression and fusion L4-L5 and bone fusion in 2020.  Reviewed notes, labs  and imaging from outside physicians, which showed:  I reviewed notes from the emergency room April 24, 2022, he was sent from ophthalmology for stroke workup, he has a history of double vision in his eyes in the last several years but has been having more double vision since the prior day, now mostly improved, resolves when he closes 1 eye, with both eyes open when he looks at a distance he sees double when he closes 1 eye gets better, he has a history of factor V Leiden and is on Eliquis, he has some drooping of his right eyelid yesterday that got better today, ophthalmologist thought he needed to get a stroke workup done with an MRI and there was a concern there may be myasthenia gravis and he was told his left pupil was bigger than his right pupil.  He had ptosis of the right eye and possibly his left pupil mildly bigger than the right but the ED did not notice any major ptosis or visual field deficits or any double vision currently.  MRI was negative for stroke.  MRI of the chest was also ordered.  CT chest: IMPRESSION: 1. No mediastinal mass or mediastinal lymphadenopathy. 2. Possible small right infrahilar consolidation or lesion. CT chest without contrast recommended to further evaluate.  Brain: There is no evidence of an acute infarct, intracranial hemorrhage, mass, midline shift, or extra-axial fluid collection. Mild cerebral atrophy is within normal limits for age. Small T2 hyperintensities in the cerebral white matter bilaterally are nonspecific but compatible with minimal chronic small vessel ischemic disease. There is a chronic lacunar infarct in the left caudate head. No abnormal enhancement is identified.   Vascular: Major intracranial vascular flow voids are preserved.   Skull and upper cervical spine: Unremarkable bone marrow signal.   Sinuses/Orbits: Unremarkable orbits. Mild mucosal thickening in the paranasal sinuses. Mucous retention cyst in the right maxillary sinus.  No significant mastoid fluid.   Other: None.   IMPRESSION: 1. No acute intracranial abnormality. 2. Minimal chronic small vessel ischemic disease.  Achr antibodies negative.   HPI 04/29/2021:  Steven Hartman is a 77 y.o. male here as requested by [name], MD for peripheral polyneuropathy. PMHx hypertension, erectile dysfunction, DVT, back pain with radiculopathy, spondylolisthesis of the lumbar region, erythema chronicum migrans, factor V Leiden mutation, sciatic pain, alpha galactosidase deficiency, spinal stenosis with surgery in 2020, Lyme testing was negative in 2019, also was tested for [facility] spotted fever, had an ultrasound compression venography of the lower extremities, ultrasound showed DVT in 4 veins of the left leg lower thigh and below the knee August 2019 spontaneous unprovoked, MRI confirmed spinal stenosis at L4-L5, CKD GFR 59.9, has seen Dr. Lovell Sheehan at [facility] for low back surgery decompression and fusion L4-L5 and bone fusion in 2020.  I reviewed Dr. Dell Ponto notes, he drinks alcohol every day, he drinks 4 or more drinks a week, drinks wine, average 2 drinks per day, never smoker, he has tingling and numbness, mild  neuropathy not changing.  Per Dr. Sherrye Payor, he needs a basic initial neuropathy evaluation possibly nerve conduction study as clinically warranted.  Patient is here alone and reports He has had back issus since 2008. He had surgery for stenosis in 2020 and since then he has weakness of the left > right leg. After surgery the weakness was more obvious. Before the surgery he had bilateral sciatica and that was improved after surgery. Prior to surgery he had  epidural steroid injections in 2018 and 2019 and when the sciatica became worse he had decompression surgery. He feels he has strong leg muscles at the gym, he can't o a toe raise with the left foot, a foot drop, but the left was worse. The pain improved after the surgery. The left leg weakness  improved a little bit but never came back to baseline and the left was the worst. The right leg is overall ok, not like the left leg. The weakness has been stable or slightly improved since 2020. The bottom of his feet feel strange sometimes like little sponges but no numbness or tingling in the toes or feet. No numbness in the hands, no weakness int he arms, no radicular cervical psin. No other focal neurologic deficits, associated symptoms, inciting events or modifiable factors.  Reviewed notes, labs and imaging from outside physicians, which showed:  Labs collected February 24, 2019 to include CMP with creatinine 1.1, BUN 20 otherwise normal, CBC unremarkable, TSH normal, vitamin D 46.5.  MRI lumbar spine 2008 (reviewed images with patient and agree: Clinical Data:  Bilateral buttock and leg pain for five months, worsening with activity.  No acute injury or prior back surgery.  MRI LUMBAR SPINE WITHOUT CONTRAST:  Technique:  Multiplanar and multiecho pulse sequences of the lumbar spine, to include the lower thoracic and upper sacral regions, were obtained according to standard protocol without IV contrast.  Comparison:  Lumbar spine radiographs 05/09/06.  Findings:  There are five lumbar type vertebral bodies in anatomic alignment.  There is no evidence of fracture or pars defect. The lumbar pedicles are relatively short on a congenital basis.  The conus medullaris extends to the mid L1 level and appears normal.  There are no paraspinal abnormalities.    Sagittal images demonstrate the T11-12 and T12-L1 discs to be normal.    L1-2:  Mild disc bulge.  L2-3:  Mild disc bulge.    L3-4:  Disc bulging and slight thickening of the ligamentum flavum result in borderline central and mild left lateral recess stenosis.  There is no foraminal compromise or definite nerve root encroachment.  L4-5:  There is moderate annular disc bulging with advanced bilateral facet hypertrophy, right greater than left.  There  is a synovial cyst extending medially from the right facet joint and contributing to right L5 nerve root compression. The ligamentum flava are thickened bilaterally, and there is underlying severe central and lateral recess stenosis bilaterally.  The neural foramina are mildly narrowed bilaterally without definite L4 nerve root encroachment.  L5-S1:  Minimal disc bulge.  Mild bilateral facet hypertrophy.  The left lateral recess is mildly narrowed without definite nerve root encroachment.  The foramina appear sufficiently patent.  IMPRESSION:  1.  The most significant findings are at L4-5 where there is moderate to severe multifactorial spinal stenosis due to annular disc bulging and bilateral facet hypertrophy.  A right sided synovial cyst contributes to right lateral recess stenosis and right L5 nerve root compression.  2.  Mild left lateral  recess stenosis at both L3-4 and L5-S1 secondary to asymmetric facet hypertrophy.  The spinal canal appears adequately patent at those levels, and there is no foraminal compromise.     Review of Systems: Patient complains of symptoms per HPI as well as the following symptoms weakness. Pertinent negatives and positives per HPI. All others negative.   Social History   Socioeconomic History   Marital status: Married    Spouse name: Not on file   Number of children: Not on file   Years of education: Not on file   Highest education level: Not on file  Occupational History   Not on file  Tobacco Use   Smoking status: Never   Smokeless tobacco: Never  Vaping Use   Vaping Use: Never used  Substance and Sexual Activity   Alcohol use: Not Currently    Alcohol/week: 6.0 standard drinks of alcohol    Types: 6 Glasses of wine per week    Comment: Wine   Drug use: Never   Sexual activity: Yes    Partners: Female  Other Topics Concern   Not on file  Social History Narrative   6-7 hours of sleep a night 2 people living in the home Drinks caffienated  beverages Exercise at least 3 times a weekTakes 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       Caffeine: 2 cups/day of coffee   Right handed   Social Determinants of Health   Financial Resource Strain: Not on file  Food Insecurity: Not on file  Transportation Needs: Not on file  Physical Activity: Not on file  Stress: Not on file  Social Connections: Not on file  Intimate Partner Violence: Not on file    Family History  Problem Relation Age of Onset   Parkinson's disease Mother 33   Arthritis Father    Rheum arthritis Father    Rheum arthritis Sister    Colon polyps Brother    Prostate cancer Brother    Colon cancer Neg Hx    Neuropathy Neg Hx     Past Medical History:  Diagnosis Date   Allergy to alpha-gal    result of a tick bite   Factor 5 Leiden mutation, heterozygous (HCC)    GERD (gastroesophageal reflux disease)    History of chicken pox    History of colon polyps    Hypertension    Sciatic pain    Synovial cyst of lumbar spine     Patient Active Problem List   Diagnosis Date Noted   Ocular myasthenia gravis (HCC) 08/22/2022   Left leg weakness 04/29/2021   Decreased sensation of foot 04/29/2021   Spondylolisthesis, lumbar region 03/22/2018   Acute deep vein thrombosis (DVT) of left lower extremity (HCC) 11/11/2017   Allergy to alpha-gal 11/10/2017   Vitamin D deficiency 08/25/2017   ECM (erythema chronicum migrans) 08/24/2017   Need for hepatitis C screening test 08/24/2017   Erectile dysfunction due to arterial insufficiency 08/24/2017   Essential hypertension 07/11/2014   Routine general medical examination at a health care facility 04/24/2013   Personal history of colonic polyps 09/30/2009   BACK PAIN WITH RADICULOPATHY 05/19/2007    Past Surgical History:  Procedure Laterality Date   BACK SURGERY  2020   lumbar spine   COLONOSCOPY  multiple   TONSILLECTOMY AND ADENOIDECTOMY   03/16/1971    Current Outpatient Medications  Medication Sig Dispense Refill   apixaban (ELIQUIS) 2.5 MG TABS tablet Take 2.5 mg  by mouth 2 (two) times daily.     Cyanocobalamin 2500 MCG SUBL Place 1 tablet under the tongue daily.     EPINEPHrine 0.3 mg/0.3 mL IJ SOAJ injection      ezetimibe (ZETIA) 10 MG tablet      fexofenadine (ALLEGRA) 180 MG tablet Take 180 mg by mouth daily as needed for allergies or rhinitis.     MICARDIS 80 MG tablet TAKE 1 TABLET BY MOUTH DAILY (Patient taking differently: at bedtime.) 90 tablet 0   Multiple Vitamins-Minerals (MULTIVITAMIN PO) Take 1 tablet by mouth daily.     pyridostigmine (MESTINON) 60 MG tablet Take 1 tablet (60 mg total) by mouth 3 (three) times daily. 30 tablet 1   rosuvastatin (CRESTOR) 20 MG tablet Take 20 mg by mouth at bedtime.     sildenafil (REVATIO) 20 MG tablet Take 4 tablets (80 mg total) by mouth daily as needed. (Patient taking differently: Take 20 mg by mouth once.) 60 tablet 11   triamcinolone (NASACORT) 55 MCG/ACT AERO nasal inhaler Place 2 sprays into the nose daily as needed (for allergies).      VITAMIN D, CHOLECALCIFEROL, PO Take 5,000 Units by mouth daily.     No current facility-administered medications for this visit.    Allergies as of 08/20/2022 - Review Complete 08/20/2022  Allergen Reaction Noted   Gelatin Other (See Comments) 03/21/2018   Meat [alpha-gal] Hives, Rash, and Other (See Comments) 03/13/2018    Vitals: BP 113/64   Pulse 64   Ht 5\' 11"  (1.803 m)   Wt 206 lb (93.4 kg)   BMI 28.73 kg/m  Last Weight:  Wt Readings from Last 1 Encounters:  08/20/22 206 lb (93.4 kg)   Last Height:   Ht Readings from Last 1 Encounters:  08/20/22 5\' 11"  (1.803 m)     Physical exam: Exam: Gen: NAD, conversant, well nourised, well groomed                     CV: RRR, no MRG. No Carotid Bruits. No peripheral edema, warm, nontender Eyes: Conjunctivae clear without exudates or hemorrhage  Neuro: Detailed  Neurologic Exam  Speech:    Speech is normal; fluent and spontaneous with normal comprehension.  Cognition:    The patient is oriented to person, place, and time;     recent and remote memory intact;     language fluent;     normal attention, concentration,     fund of knowledge Cranial Nerves:   Right pupil 1mm smalller in light but equally reactive.  The pupils are equal, round, and reactive to light. The fundi are flat Visual fields are full to finger confrontation. Extraocular movements are intact. Trigeminal sensation is intact and the muscles of mastication are normal. Right ptosis. The palate elevates in the midline. Hearing intact. Voice is normal. Shoulder shrug is normal. The tongue has normal motion without fasciculations.   Coordination:    Normal    Gait:    normal.   Motor Observation:    No asymmetry, no atrophy, and no involuntary movements noted. Tone:    Normal muscle tone.    Posture:    Posture is normal. normal erect    Strength: 4-/5 left hip flexion and left toe extension. Otherwise strength is V/V in the upper and lower limbs.      Sensation: decrease to pin prick and temp slightly distally, vibration a few seconds at the proximal joint, proprioception is intact.  Reflex Exam:  DTR's:    Absent AJs Brisk patellars left >> right Brisk uppers lly.   Toes:    The toes are downgoing bilaterally.   Clonus:    Clonus is absent.    Assessment/Plan: Patient here for Right opthalmoplegia with ptosis and miosis. MHx hypertension, erectile dysfunction, DVT, back pain with radiculopathy, b12 deficiency, alcohol use nightly, spondylolisthesis of the lumbar region, erythema chronicum migrans, factor V Leiden mutation, sciatic pain, alpha galactosidase deficiency, spinal stenosis with surgery in 2020, Lyme testing was negative in 2019, also was tested for [facility] spotted fever, had an ultrasound compression venography of the lower extremities, ultrasound  showed DVT in 4 veins of the left leg lower thigh and below the knee August 2019 spontaneous unprovoked, MRI confirmed spinal stenosis at L4-L5, CKD GFR 59.9, has seen Dr. Lovell Sheehan at [facility] for low back surgery decompression and fusion L4-L5 and bone fusion in 2020.B12 deficiency   Extensive Workup has been unrevealing. Likely Ocular myasthenia gravis: Next time double vision  try mestinon, start with 1/2 pill and then try whole pill up to three times day. Also meds below not to take. Follow up on lung nodule. If he has symptoms again can try a prednisone taper.    Meds ordered this encounter  Medications   pyridostigmine (MESTINON) 60 MG tablet    Sig: Take 1 tablet (60 mg total) by mouth 3 (three) times daily.    Dispense:  30 tablet    Refill:  1     Right opthalmoplegia with ptosis and miosis. Sounds more like horner's but the binocular diplopia and ophthalmoplegia(3,6,superior oblique) doesn't fit. MRi brain unrevealing. MRI c-spine unrevealing. MRI chest, no thymic disorder.  2.  Atypical for ocular myasthenia gravis and So far acetylcoline antibodies negative still pending; however antibodies are often negative in our ocular MG. If it is pure ocular MG and he has had it 5-6 years unlikely to generalize would just monitor.  3.    MRI chest showed No mediastinal mass or mediastinal lymphadenopathy, Possible small right infrahilar consolidation or lesion and CT chest without contrast recommended to further evaluate.   To evaluate for horner syndrome : would also add in CTA H&N, if negative other less common causes of horner syndrome could be in the cervical spine(1st order: cervical tumor, demyelination; 2nd order:syringomyelia, transverse myelitis, cervicaal cord infarction) or brachial plexus injury so could also repeat MRI chest focusing on the brachial plexus. ALL UNRVEALING  Cavernous sinus pathology: He had cranial nerve 3,4, and 6 palsy all of which traverse the  cavernous sinus, need MRI orbits.  - Hoarseness  and throat pain - will send to ENT. H&N cancer? Paraneoplastic sequelae? No findings to support  - Serita Sheller hunt? No findings to support   No orders of the defined types were placed in this encounter.   PRIOR ASSESSMENT AND PLAN:   This is a very nice 77 year old patient who has had chronic back pain and degenerative disease since at least 2008 who reports residual left leg weakness after L4-L5 decompression, instrumentation and fusion in 2020 due to lumbar spinal stenosis with neurogenic claudication and lumbar radiculopathy.  -He has residual very mild left hip flexion and toe extension weakness.  He reports this has improved since surgery.  He has no new symptoms.  I believe this is residual from his low back issues and given that he does not have any new symptoms and he is actually improved since surgery I do not think  an EMG nerve conduction study would provide [place] with any additional information.  -On examination I did notice a decrease distally in the feet to pinprick, temperature, vibration consistent with a very mild peripheral neuropathy.  Risk factors b12 deficiency 04/2021 226 and possibly etoh, it does not appear to bother him or be progressive and could be partially residual from his radiculopathy status post surgery.  I will order several blood tests and monitor clinically.  He does drink 2 glasses of wine a night and I question whether long-term alcohol use, even if it is within the accepted guidelines, can sometimes cause a mild distal peripheral polyneuropathy.   - prior B12 deficiency 04/29/2021 226 1 year ago, 08/25/2021 was corrected - B6, b1, hgba1c 04/29/2021 nml -    - RTC as needed.  If he has new or worsening symptoms we can order an EMG nerve conduction study and I did discuss this with him.  He can contact me through MyChart as needed.  No orders of the defined types were placed in this encounter.  Meds ordered this  encounter  Medications   pyridostigmine (MESTINON) 60 MG tablet    Sig: Take 1 tablet (60 mg total) by mouth 3 (three) times daily.    Dispense:  30 tablet    Refill:  1    Cc: [name], MD,  [name], MD  Naomie Dean, MD  [facility] [address] [place], [place] [address]  Phone [phone] Fax [phone] I spent over 30 minutes of face-to-face and non-face-to-face time with patient on the  1. Ocular myasthenia gravis (HCC)     diagnosis.  This included previsit chart review, lab review, study review, order entry, electronic health record documentation, patient education on the different diagnostic and therapeutic options, counseling and coordination of care, risks and benefits of management, compliance, or risk factor reduction

## 2022-08-20 NOTE — Patient Instructions (Addendum)
Next time double vision  try mestinon, start with 1/2 pill and then try whole pill up to three times day. Also meds below not to take. Follow up on lung nodule. If he has symptoms again can try a prednisone taper.   Myasthenia Gravis Myasthenia gravis (MG) is a long-term, or chronic, condition that causes weakness in the muscles you can control (voluntary muscles). MG can affect any voluntary muscle. The muscles most often affected are the ones that control: Eye movement. Facial movements. Swallowing. MG is a disease in which the body's disease-fighting system (immune system) attacks its own healthy tissues. This type of disease is known as an autoimmune disease. What are the causes? This condition is caused by a type of protein (antibody) that interferes with how the nerves and muscles communicate. When you have MG, your immune system makes antibodies that block a chemical called acetylcholine that your body needs to send nerve signals to your muscles. This causes muscle weakness. What increases the risk? The following factors may make you more likely to develop this condition: Having an enlarged thymus gland. The thymus gland is located under the breastbone. It makes certain cells for the immune system. Having a family history of MG. What are the signs or symptoms? Symptoms of MG may include: Drooping eyelids. Double vision. Muscle weakness that gets worse with activity and gets better after rest. Trouble chewing and swallowing. Trouble making facial expressions. Slurred speech. Difficulty walking. Weakness of the arms, hands, and legs. Sudden, severe difficulty breathing (myasthenic crisis) may develop after having: An infection. A fever. A bad reaction to a medicine. Myasthenic crisis requires emergency breathing support. Sometimes symptoms of MG go away for a while (remission) and then come back later. How is this diagnosed? This condition may be diagnosed based on: Your symptoms  and medical history. A physical exam. Blood tests. Tests of your muscle strength and function. Imaging tests, such as a CT scan of the chest. How is this treated? The goal of treatment is to improve muscle strength. Treatment may include: Taking medicine. Doing physical therapy exercises to gain strength. Having surgery to remove the thymus gland (thymectomy). This may result in a long remission for some people. Having a procedure to remove the acetylcholine antibodies (plasmapheresis). Getting emergency breathing support, if you experience myasthenic crisis. If you experience remission, you may be able to stop treatment and then resume treatment when your symptoms return. Follow these instructions at home:  Take over-the-counter and prescription medicines only as told by your health care provider. Get plenty of rest and sleep. Take frequent breaks to rest your eyes, especially when in bright light or working on a computer. Maintain a healthy diet and a healthy weight. Work with your health care provider or a dietitian if you need help. Do exercises as told by your health care provider or physical therapist. Do not use any products that contain nicotine or tobacco. These products include cigarettes, chewing tobacco, and vaping devices, such as e-cigarettes. If you need help quitting, ask your health care provider. Prevent infections by: Washing your hands often with soap and water for at least 20 seconds. If soap and water are not available, use hand sanitizer. Avoiding contact with other people who are sick. Avoiding touching your eyes, nose, and mouth. Using a disinfectant to regularly clean surfaces in your home that are touched often. Keep all follow-up visits. This is important. Where to find more information [employer] [facility] Neurological Disorders and Stroke: [email or web address] Contact a  health care provider if: Your symptoms change or get worse, especially after having a fever  or infection. Get help right away if: You have trouble breathing. You develop new swallowing problems. You develop persistent double vision. These symptoms may be an emergency. Get help right away. Call 911. Do not wait to see if the symptoms will go away. Do not drive yourself to the hospital. Summary Myasthenia gravis (MG) is a long-term, or chronic, condition that causes weakness in the muscles you can control (voluntary muscles). A symptom of MG is muscle weakness that gets worse with activity and gets better after rest. Sudden, severe difficulty breathing (myasthenic crisis) may develop after having an infection, a fever, or a bad reaction to a medicine. The goal of treatment is to improve muscle strength. Treatment may include medicines, lifestyle changes, physical therapy, surgery, plasmapheresis, or emergency breathing support. This information is not intended to replace advice given to you by your health care provider. Make sure you discuss any questions you have with your health care provider. Document Revised: 12/19/2020 Document Reviewed: 12/19/2020 Elsevier Patient Education  2024 Elsevier Inc.  Pyridostigmine Tablets What is this medication? PYRIDOSTIGMINE (peer id oh STIG meen) treats myasthenia gravis, a condition that causes muscles to easily weaken or fatigue. It works by decreasing muscle weakness and loss of movement. This medicine may be used for other purposes; ask your health care provider or pharmacist if you have questions. COMMON BRAND NAME(S): Mestinon What should I tell my care team before I take this medication? They need to know if you have any of these conditions: Asthma Difficulty passing urine Heart disease Infection in abdomen, peritonitis Irregular, slow heartbeat Kidney disease Seizures Stomach or bowel obstruction or ulcers Thyroid disease An unusual or allergic reaction to pyridostigmine, bromides, other medications, foods, dyes, or  preservatives Pregnant or trying to get pregnant Breast-feeding How should I use this medication? Take this medication by mouth with a glass of water. Take it as directed on the prescription label. Keep taking it unless your care team tells you to stop. Talk to your care team about the use of this medication in children. Special care may be needed. Overdosage: If you think you have taken too much of this medicine contact a poison control center or emergency room at once. NOTE: This medicine is only for you. Do not share this medicine with others. What if I miss a dose? If you miss a dose, take it as soon as you can. If it is almost time for your next dose, take only that dose. Do not take double or extra doses. What may interact with this medication? Do not take this medication with any of the following: Other medications for myasthenia gravis like neostigmine Quinine This medication may also interact with the following: Atropine Bethanechol Disopyramide Edrophonium Guanadrel Guanethidine Mecamylamine Medications that block muscle or nerve pain This list may not describe all possible interactions. Give your health care provider a list of all the medicines, herbs, non-prescription drugs, or dietary supplements you use. Also tell them if you smoke, drink alcohol, or use illegal drugs. Some items may interact with your medicine. What should I watch for while using this medication? Visit your care team for regular checks on your progress. Tell your care team if your symptoms do not start to get better or if they get worse. Wear a medical ID bracelet or chain. Carry a card that describes your condition. List the medications and doses you take on the card. What  side effects may I notice from receiving this medication? Side effects that you should report to your care team as soon as possible: Allergic reactions--skin rash, itching, hives, swelling of the face, lips, tongue, or throat Side  effects that usually do not require medical attention (report to your care team if they continue or are bothersome): Diarrhea Excessive sweating Muscle pain or cramps Muscle weakness Nausea Stomach pain This list may not describe all possible side effects. Call your doctor for medical advice about side effects. You may report side effects to FDA at 1-800-FDA-1088. Where should I keep my medication? Keep out of the reach of children and pets. Store between 15 and 30 degrees C (59 and 86 degrees F). Protect from moisture. Keep the container tightly closed. Get rid of any unused medication after the expiration date. To get rid of medications that are no longer needed or have expired: Take the medication to a medication take-back program. Check with your pharmacy or law enforcement to find a location. If you cannot return the medication, check the label or package insert to see if the medication should be thrown out in the garbage or flushed down the toilet. If you are not sure, ask your care team. If it is safe to put it in the trash, pour the medication out of the container. Mix the medication with cat litter, dirt, coffee grounds, or other unwanted substance. Seal the mixture in a bag or container. Put it in the trash. NOTE: This sheet is a summary. It may not cover all possible information. If you have questions about this medicine, talk to your doctor, pharmacist, or health care provider.  2024 Elsevier/Gold Standard (2021-04-20 00:00:00)   Discussed: Drugs to avoid or use with caution in MG* Many different drugs have been associated with worsening myasthenia gravis (MG). However, these drug associations do not necessarily mean that a patient with MG should not be prescribed these medications. In many instances, reports of worsening MG are very rare. In some instances, there may only be a "chance" association (i.e. not causal). In addition, some of these drugs may be necessary for a  patient's treatment and should not be deemed "off limits". It is advisable that patients and physicians recognize and discuss the possibility that a particular drug might worsen the patient's MG. They should also consider, when appropriate, the pros and cons of an alternate treatment, if available. It is important that the patient notify his or her physicians if the symptoms of MG worsen after starting any new medication. Only the more common prescription drugs with the strongest evidence suggesting an association with worsening MG are provided in this list.  Telithromycin: antibiotic for community acquired pneumonia. The [place] FDA has designated a "black box" warning for this drug in MG. Should not be used in MG.  Fluoroquinolones (e.g., ciprofloxacin, moxifloxacin and levofloxacin): commonly prescribed broadspectrum antibiotics that are associated with worsening MG. The [place] FDA has designated a "black box" warning for these agents in MG. Use cautiously, if at all.  Botulinum toxin: Avoid.  D-penicillamine: used for Wilson disease and rarely for rheumatoid arthritis. Strongly associated with causing MG. Avoid.  Quinine: occasionally used for leg cramps. Use prohibited except in malaria in [place].  Magnesium: potentially dangerous if given intravenously, i.e. for eclampsia during late pregnancy or for hypomagnesemia. Use only if absolutely necessary and observe for worsening. CAUTIONARY DRUGS Certain medications and over the counter preparations may cause worsening of MG symptoms. Remember to tell any doctor or dentist  about your MG diagnosis. It is important to check with your doctor before starting any new medication including over the counter medications or preparations.  Macrolide antibiotics (e.g., erythromycin, azithromycin, clarithromycin): commonly prescribed antibiotics for gram-positive bacterial infections. May worsen MG. Use cautiously, if at all.  Aminoglycoside  antibiotics (e.g., gentamycin, neomycin, tobramycin): used for gram-negative bacterial infections. May worsen MG. Use cautiously if no alternative treatment available.  Corticosteroids: A standard treatment for MG, but may cause transient worsening within the first two weeks. Monitor carefully for this possibility.  Procainamide: used for irregular heart rhythm. May worsen MG. Use with caution.  Desferrioxamine: Chelating agent used for hemochromatosis. May worsen MG.  Beta-blockers: commonly prescribed for hypertension, heart disease and migraine but potentially dangerous in MG. May worsen MG. Use cautiously.  Statins (e.g., atorvastatin, pravastatin, rosuvastatin, simvastatin): used to reduce serum cholesterol. May worsen or precipitate MG. Use cautiously if indicated and at lowest dose needed.  Iodinated radiologic contrast agents: older reports document increased MG weakness, but modern contrast agents appear safer. Use cautiously and observe for worsening.

## 2022-08-22 DIAGNOSIS — G7000 Myasthenia gravis without (acute) exacerbation: Secondary | ICD-10-CM | POA: Insufficient documentation

## 2022-09-14 ENCOUNTER — Ambulatory Visit
Admission: RE | Admit: 2022-09-14 | Discharge: 2022-09-14 | Disposition: A | Discharge status: Home / Self Care | Payer: PPO | Source: Ambulatory Visit | Attending: Internal Medicine | Admitting: Internal Medicine

## 2022-09-14 DIAGNOSIS — R911 Solitary pulmonary nodule: Secondary | ICD-10-CM

## 2022-09-14 DIAGNOSIS — I700 Atherosclerosis of aorta: Secondary | ICD-10-CM | POA: Diagnosis not present

## 2022-09-14 DIAGNOSIS — R918 Other nonspecific abnormal finding of lung field: Secondary | ICD-10-CM | POA: Diagnosis not present

## 2022-10-04 DIAGNOSIS — H532 Diplopia: Secondary | ICD-10-CM | POA: Diagnosis not present

## 2022-10-05 ENCOUNTER — Telehealth: Payer: Self-pay | Admitting: Neurology

## 2022-10-05 DIAGNOSIS — R490 Dysphonia: Secondary | ICD-10-CM | POA: Diagnosis not present

## 2022-10-05 DIAGNOSIS — K219 Gastro-esophageal reflux disease without esophagitis: Secondary | ICD-10-CM | POA: Diagnosis not present

## 2022-10-05 DIAGNOSIS — G7000 Myasthenia gravis without (acute) exacerbation: Secondary | ICD-10-CM | POA: Diagnosis not present

## 2022-10-05 NOTE — Telephone Encounter (Signed)
Returned call, left a message with my cell number and patient ID on Dr. Burna Sis cell phone.

## 2022-10-05 NOTE — Telephone Encounter (Signed)
Dr. Benjamine Mola is requesting a phone call from Dr. Lucia Gaskins when you are free to talk.

## 2022-10-07 NOTE — Telephone Encounter (Signed)
Caled twice now, left 2 messages on her cell. Asked her to text me if she would like to discuss him and we can then set up a call time left my cell number on her VM

## 2022-10-26 DIAGNOSIS — Z6828 Body mass index (BMI) 28.0-28.9, adult: Secondary | ICD-10-CM | POA: Diagnosis not present

## 2022-10-26 DIAGNOSIS — M4316 Spondylolisthesis, lumbar region: Secondary | ICD-10-CM | POA: Diagnosis not present

## 2022-11-09 DIAGNOSIS — G629 Polyneuropathy, unspecified: Secondary | ICD-10-CM | POA: Diagnosis not present

## 2022-11-09 DIAGNOSIS — E785 Hyperlipidemia, unspecified: Secondary | ICD-10-CM | POA: Diagnosis not present

## 2022-11-09 DIAGNOSIS — M791 Myalgia, unspecified site: Secondary | ICD-10-CM | POA: Diagnosis not present

## 2022-11-16 DIAGNOSIS — I10 Essential (primary) hypertension: Secondary | ICD-10-CM | POA: Diagnosis not present

## 2022-11-16 DIAGNOSIS — E785 Hyperlipidemia, unspecified: Secondary | ICD-10-CM | POA: Diagnosis not present

## 2022-11-24 DIAGNOSIS — M25511 Pain in right shoulder: Secondary | ICD-10-CM | POA: Diagnosis not present

## 2022-11-24 DIAGNOSIS — E785 Hyperlipidemia, unspecified: Secondary | ICD-10-CM | POA: Diagnosis not present

## 2022-11-24 DIAGNOSIS — D72829 Elevated white blood cell count, unspecified: Secondary | ICD-10-CM | POA: Diagnosis not present

## 2022-11-24 DIAGNOSIS — M25512 Pain in left shoulder: Secondary | ICD-10-CM | POA: Diagnosis not present

## 2022-12-09 ENCOUNTER — Ambulatory Visit: Payer: PPO | Admitting: Sports Medicine

## 2022-12-09 VITALS — BP 134/80 | Ht 71.0 in | Wt 205.0 lb

## 2022-12-09 DIAGNOSIS — S46001A Unspecified injury of muscle(s) and tendon(s) of the rotator cuff of right shoulder, initial encounter: Secondary | ICD-10-CM | POA: Diagnosis not present

## 2022-12-09 DIAGNOSIS — S46002A Unspecified injury of muscle(s) and tendon(s) of the rotator cuff of left shoulder, initial encounter: Secondary | ICD-10-CM | POA: Diagnosis not present

## 2022-12-09 NOTE — Progress Notes (Signed)
PCP: [name], MD  SUBJECTIVE:   HPI:  Patient is a 77 y.o. male here with chief complaint of bilateral shoulder pain. He reports that roughly 2 months ago he was golfing and after hitting nearly 100 golf balls he developed pain in both shoulders.  Pain located in the lateral aspects of both shoulders.  Exacerbated with overhead and arm extended activities particularly golfing.  He did have some leftover prednisone that he treated himself with for 2 days and had great improvement though reflared his pain while golfing.  He was evaluated by his PCP who treated him with a steroid Dosepak which again helped his pain though he has had some return however much milder than previous.  He has been managing with Tylenol since as he cannot take NSAIDs secondary to being on Eliquis for factor V Leiden.  No prior shoulder surgeries or injuries that he is aware of.  He has had no prior imaging.  He does have a history of alpha gal as well as ocular myasthenia gravis for which she has recently been put on pyridostigmine.  He is curious if his myasthenia gravis could be playing into his shoulder pain.  ROS:     See HPI  PERTINENT  PMH / PSH FH / / SH:  Past Medical, Surgical, Social, and Family History Reviewed & Updated in the EMR.  Pertinent findings include:  Alpha gal, myasthenia gravis, factor V Leiden  Allergies  Allergen Reactions   Gelatin Other (See Comments)    ANY MEAT DERIVED PRODUCTS INCLUDING GELATIN, GELATIN CAPSULES, ETC.   Meat [Alpha-Gal] Hives, Rash and Other (See Comments)    Beef, Lamb, Goat, Pork etc. No products from any hoofed animal     OBJECTIVE:  BP 134/80   Ht 5\' 11"  (1.803 m)   Wt 205 lb (93 kg)   BMI 28.59 kg/m   PHYSICAL EXAM:  GEN: Alert and Oriented, NAD, comfortable in exam room RESP: Unlabored respirations, symmetric chest rise PSY: normal mood, congruent affect   MSK EXAM: Bilateral Shoulders: No swelling, ecchymoses.  No gross deformity. No TTP. Full  flexion, abduction, external rotation, limited internal rotation to L5 with positive pain. Positive Hawkins, negative Neers. Positive speeds and Yergasons. Strength 5/5 with empty can and resisted internal/external rotation, though pain with empty can, full can and resisted internal rotation. Negative apprehension. NV intact distally.   ASSESSMENT & PLAN:  1. Injury of tendon of left rotator cuff, initial encounter 2. Injury of tendon of right rotator cuff, initial encounter History and exam most consistent with bilateral rotator cuff tendinopathy.  Reassuringly strength is fully intact so do not suspect significant tearing present.  Do not suspect his myasthenia gravis to be contributing.  He is limited from an anti-inflammatory perspective as he cannot take NSAIDs described in HPI, but has been managing pain with Tylenol and is already status post a steroid Dosepak.  Pain not severe enough to warrant injections at this time and we did review home exercise program for him to start.  He will follow-up in 6 weeks for diagnostic ultrasound of bilateral shoulders and consideration of corticosteroid injection if failing to improve.  His questions were answered and he is in agreement with this plan.    Glean Salen, MD PGY-4, Sports Medicine Fellow [facility]  I observed and examined the patient with the [facility] resident and agree with assessment and plan.  Note reviewed and modified by me. Sterling Big, MD

## 2022-12-14 ENCOUNTER — Telehealth: Payer: Self-pay | Admitting: Neurology

## 2022-12-14 NOTE — Telephone Encounter (Signed)
Called patient.  He has been taking the mestinon 30mg  x 3 doses, and now 60mg  po tid for the last 10 days.  This was for the few a week of eye strain, fuzziness, heaviness.  Has never gotten to double vision. Since taking the medication has noted a decrease in heaviness/less strain.  He wanted to clarify if taking correctly and if so would need 90 tablets.  Would he take this continuously? Or when he has increased symptoms?  Prior to the 10 days, he would have brief sx - occasional feeling.  Last seen 08-2022 and next appt 08/2023

## 2022-12-14 NOTE — Telephone Encounter (Signed)
Pt is asking for a call to address the amount of pills called in for his pyridostigmine (MESTINON) 60 MG tablet .  Pt states with the amount he is to take daily and the amount called in he would only have a 10 day supply.  Pt is asking if this was done on purpose or if Dr Lucia Gaskins is willing to call in enough that he may have a 30 day supply, please call and advise.

## 2022-12-15 MED ORDER — PYRIDOSTIGMINE BROMIDE 60 MG PO TABS: 60.0000 mg | ORAL_TABLET | Freq: Three times a day (TID) | ORAL | 1 refills | Status: AC | PRN

## 2022-12-15 NOTE — Telephone Encounter (Signed)
Should be taken as needed, if he is not having symptoms he does not need to take it please let him know, thank you

## 2022-12-15 NOTE — Telephone Encounter (Signed)
I called the patient and LVM asking for call back or he can respond to MyChart message that I am sending now.

## 2023-01-24 DIAGNOSIS — H2513 Age-related nuclear cataract, bilateral: Secondary | ICD-10-CM | POA: Diagnosis not present

## 2023-01-24 DIAGNOSIS — H02431 Paralytic ptosis of right eyelid: Secondary | ICD-10-CM | POA: Diagnosis not present

## 2023-01-24 DIAGNOSIS — H532 Diplopia: Secondary | ICD-10-CM | POA: Diagnosis not present

## 2023-01-24 DIAGNOSIS — H524 Presbyopia: Secondary | ICD-10-CM | POA: Diagnosis not present

## 2023-01-24 DIAGNOSIS — H35031 Hypertensive retinopathy, right eye: Secondary | ICD-10-CM | POA: Diagnosis not present

## 2023-03-01 DIAGNOSIS — H1132 Conjunctival hemorrhage, left eye: Secondary | ICD-10-CM | POA: Diagnosis not present

## 2023-05-30 DIAGNOSIS — E785 Hyperlipidemia, unspecified: Secondary | ICD-10-CM | POA: Diagnosis not present

## 2023-05-30 DIAGNOSIS — Z125 Encounter for screening for malignant neoplasm of prostate: Secondary | ICD-10-CM | POA: Diagnosis not present

## 2023-05-30 DIAGNOSIS — I129 Hypertensive chronic kidney disease with stage 1 through stage 4 chronic kidney disease, or unspecified chronic kidney disease: Secondary | ICD-10-CM | POA: Diagnosis not present

## 2023-05-30 DIAGNOSIS — I2510 Atherosclerotic heart disease of native coronary artery without angina pectoris: Secondary | ICD-10-CM | POA: Diagnosis not present

## 2023-05-30 DIAGNOSIS — Z1389 Encounter for screening for other disorder: Secondary | ICD-10-CM | POA: Diagnosis not present

## 2023-05-30 DIAGNOSIS — E559 Vitamin D deficiency, unspecified: Secondary | ICD-10-CM | POA: Diagnosis not present

## 2023-05-30 DIAGNOSIS — Z1212 Encounter for screening for malignant neoplasm of rectum: Secondary | ICD-10-CM | POA: Diagnosis not present

## 2023-06-08 DIAGNOSIS — I2510 Atherosclerotic heart disease of native coronary artery without angina pectoris: Secondary | ICD-10-CM | POA: Diagnosis not present

## 2023-06-08 DIAGNOSIS — R918 Other nonspecific abnormal finding of lung field: Secondary | ICD-10-CM | POA: Diagnosis not present

## 2023-06-08 DIAGNOSIS — Z0000 Encounter for general adult medical examination without abnormal findings: Secondary | ICD-10-CM | POA: Diagnosis not present

## 2023-06-08 DIAGNOSIS — G629 Polyneuropathy, unspecified: Secondary | ICD-10-CM | POA: Diagnosis not present

## 2023-06-08 DIAGNOSIS — Z91018 Allergy to other foods: Secondary | ICD-10-CM | POA: Diagnosis not present

## 2023-06-08 DIAGNOSIS — D6851 Activated protein C resistance: Secondary | ICD-10-CM | POA: Diagnosis not present

## 2023-06-08 DIAGNOSIS — R82998 Other abnormal findings in urine: Secondary | ICD-10-CM | POA: Diagnosis not present

## 2023-06-08 DIAGNOSIS — I10 Essential (primary) hypertension: Secondary | ICD-10-CM | POA: Diagnosis not present

## 2023-06-08 DIAGNOSIS — G7000 Myasthenia gravis without (acute) exacerbation: Secondary | ICD-10-CM | POA: Diagnosis not present

## 2023-06-08 DIAGNOSIS — E785 Hyperlipidemia, unspecified: Secondary | ICD-10-CM | POA: Diagnosis not present

## 2023-06-08 DIAGNOSIS — N529 Male erectile dysfunction, unspecified: Secondary | ICD-10-CM | POA: Diagnosis not present

## 2023-06-08 DIAGNOSIS — I700 Atherosclerosis of aorta: Secondary | ICD-10-CM | POA: Diagnosis not present

## 2023-06-08 DIAGNOSIS — M48061 Spinal stenosis, lumbar region without neurogenic claudication: Secondary | ICD-10-CM | POA: Diagnosis not present

## 2023-08-18 ENCOUNTER — Telehealth: Payer: Self-pay | Admitting: Neurology

## 2023-08-18 NOTE — Telephone Encounter (Signed)
 Patient called to verify and confirm appointment.

## 2023-08-22 ENCOUNTER — Encounter: Payer: Self-pay | Admitting: Neurology

## 2023-08-22 ENCOUNTER — Telehealth: Payer: Self-pay | Admitting: Neurology

## 2023-08-22 ENCOUNTER — Telehealth (INDEPENDENT_AMBULATORY_CARE_PROVIDER_SITE_OTHER): Payer: PPO | Admitting: Neurology

## 2023-08-22 DIAGNOSIS — G7000 Myasthenia gravis without (acute) exacerbation: Secondary | ICD-10-CM | POA: Diagnosis not present

## 2023-08-22 NOTE — Telephone Encounter (Signed)
 Scheduled annual f/u VV for 08/21/24 @ 9:30 am.

## 2023-08-22 NOTE — Progress Notes (Signed)
 [facility]    Provider:  Dr Tresia Fruit Requesting Provider: Aldo Hun, MD Primary Care Provider:  Aldo Hun, MD  CC:  diplopia  Virtual Visit via Video Note  I connected with Steven Hartman on 08/22/23 at  9:30 AM EDT by a video enabled telemedicine application and verified that I am speaking with the correct person using two identifiers.  Location: Patient: Home Provider: Office   I discussed the limitations of evaluation and management by telemedicine and the availability of in person appointments. The patient expressed understanding and agreed to proceed.  History of Present Illness:     I discussed the assessment and treatment plan with the patient. The patient was provided an opportunity to ask questions and all were answered. The patient agreed with the plan and demonstrated an understanding of the instructions.   The patient was advised to call back or seek an in-person evaluation if the symptoms worsen or if the condition fails to improve as anticipated.  I provided 20 minutes of non-face-to-face time during this encounter.   Glory Larsen, MD   August 22, 2023: Patient with a history of diplopia which is a last appointment resolved, extensive workup was negative thought to be possibly mild ocular myasthenia gravis.  We discussed Mestinon  treatment if needed.  He has not generalizing close to 7 years unlikely this will happen.  Checking in with him today for 1 year follow-up. 1-2x a month can have some brief diplopia he closes his eyes for 10-15 seconds and it goes away. Stable. Varies, last February happened mid morning and was drastic and has not been noticing any time of day, but started about 8 years ago, worse with fatigue which is the biggest impact. He has FV Leiden. He has RA in the family and another member had a platelet disorder. He has mestinon  unless he had a moderate to severe episode. Do not take fo rmild/transient episodes.  Discussed there are no recommendations for choline supplementation in MG. Things like magnesium contraindicated. No other focal neurologic deficits, associated symptoms, inciting events or modifiable factors.  Patient complains of symptoms per HPI as well as the following symptoms: low HDL, tiredness, fatigue . Pertinent negatives and positives per HPI. All others negative   08/20/2022: He is doing well, getting another CT scan chest. ENT appointment. Myasthenia gravis labs were neg. Neg thymic imaging. Labs were all unremarkable. No more diplopia. Dr. Candi Chafe . May be mild MG mild will I've mestinon  to treat when he has it. He has no muscle weakness. DOubtful microvascular ischemia. Extensive imaging did not explain the symptoms. Sometimes feels it coming on, closes eyes for a little bit and resolves. He has gotten a lid droop on the right. But also since he has not generalized in close to 7 years unlikely he will geralize but discussed symptoms to go to the ed. No swallowing problems more dry food situation, he had a hoarse voice and has some reflux. Discussed meds not to take with MG. 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   Patient complains of symptoms per HPI as well as the following symptoms: none . Pertinent negatives and positives per HPI. All others negative   05/18/2022; New chief complaint 04/27/2022: Here with his wife, started 5-6 years ago would last briefly, would be vertical double vision, closing one eye made it go away, would happen possibly with tired, seemed to go away on its own, 1-2x a month and then he saw Dr. Candi Chafe and he couldn't explin  it but wasn't having the episodes, This episode was unusual, hadn't had any episodes in 3-4 months, on Friday morning he had taken his car somewhere and he felt it going on, this time was horizontal with a slight skew worse when looked to the left. Right ptosis and right pupil was slightly smaller. Lasted about 50 hours. Today he is asymptomatic. Started  Friday at 1am and was better around Sunday slowly resolved and as it began to go away could still see double vision one next to the other when looking to the left but looking straight on it was ok and looking to the right the double vision had gone away as well. No head pain, no jaw pain, if he closes each eye he was fine.    Per Dr. Adonis Alamin ophthalmology notes, patient was seen 04/24/2022, double vision that started 11 AM in the morning it was constant resolved by closing 1 eye he has had a history of diplopia in the past 5 to 6 years ago transient episodes lasting for seconds to 5 minutes.  Both images are up-and-down and side-by-side.  He has a history of factor V Leiden and is on Eliquis .  Examination showed no lid fatigability, right pupil in the dark was 2.5 mm left pupil in the dark was 3 mm, right pupil in the light was 2 mm, left pupil in the light was 2 mm.  No APD.  Right hypertropia with exotropia worse in left gaze and downgaze.  Would be more associated with Horner's but would not explain the diplopia.  Dr. Lasandra Points recommended going to the emergency room and getting an MRI of the chest, MRI of the brain, acetylcholine receptor antibodies for myasthenia gravis.  Dr. Lasandra Points noted does not completely fit a 3rd nerve palsy, superior oblique or 6th nerve palsy, does have anisocoria worse in the dark with right upper eyelid ptosis.  There is a need to rule out Horner syndrome.  PMHx hypertension, erectile dysfunction, DVT, back pain with radiculopathy, b12 deficiency, alcohol  use nightly, spondylolisthesis of the lumbar region, erythema chronicum migrans, factor V Leiden mutation, sciatic pain, alpha galactosidase deficiency, spinal stenosis with surgery in 2020, Lyme testing was negative in 2019, also was tested for [facility] spotted fever, had an ultrasound compression venography of the lower extremities, ultrasound showed DVT in 4 veins of the left leg lower thigh and below the knee August 2019  spontaneous unprovoked, MRI confirmed spinal stenosis at L4-L5, CKD GFR 59.9, has seen Dr. Larrie Po at [facility] for low back surgery decompression and fusion L4-L5 and bone fusion in 2020.  Reviewed notes, labs and imaging from outside physicians, which showed:  I reviewed notes from the emergency room April 24, 2022, he was sent from ophthalmology for stroke workup, he has a history of double vision in his eyes in the last several years but has been having more double vision since the prior day, now mostly improved, resolves when he closes 1 eye, with both eyes open when he looks at a distance he sees double when he closes 1 eye gets better, he has a history of factor V Leiden and is on Eliquis , he has some drooping of his right eyelid yesterday that got better today, ophthalmologist thought he needed to get a stroke workup done with an MRI and there was a concern there may be myasthenia gravis and he was told his left pupil was bigger than his right pupil.  He had ptosis of the right eye and possibly  his left pupil mildly bigger than the right but the ED did not notice any major ptosis or visual field deficits or any double vision currently.  MRI was negative for stroke.  MRI of the chest was also ordered.  CT chest: IMPRESSION: 1. No mediastinal mass or mediastinal lymphadenopathy. 2. Possible small right infrahilar consolidation or lesion. CT chest without contrast recommended to further evaluate.  Brain: There is no evidence of an acute infarct, intracranial hemorrhage, mass, midline shift, or extra-axial fluid collection. Mild cerebral atrophy is within normal limits for age. Small T2 hyperintensities in the cerebral white matter bilaterally are nonspecific but compatible with minimal chronic small vessel ischemic disease. There is a chronic lacunar infarct in the left caudate head. No abnormal enhancement is identified.   Vascular: Major intracranial vascular flow voids are  preserved.   Skull and upper cervical spine: Unremarkable bone marrow signal.   Sinuses/Orbits: Unremarkable orbits. Mild mucosal thickening in the paranasal sinuses. Mucous retention cyst in the right maxillary sinus. No significant mastoid fluid.   Other: None.   IMPRESSION: 1. No acute intracranial abnormality. 2. Minimal chronic small vessel ischemic disease.  Achr antibodies negative.   HPI 04/29/2021:  Steven Hartman is a 78 y.o. male here as requested by Aldo Hun, MD for peripheral polyneuropathy. PMHx hypertension, erectile dysfunction, DVT, back pain with radiculopathy, spondylolisthesis of the lumbar region, erythema chronicum migrans, factor V Leiden mutation, sciatic pain, alpha galactosidase deficiency, spinal stenosis with surgery in 2020, Lyme testing was negative in 2019, also was tested for [facility] spotted fever, had an ultrasound compression venography of the lower extremities, ultrasound showed DVT in 4 veins of the left leg lower thigh and below the knee August 2019 spontaneous unprovoked, MRI confirmed spinal stenosis at L4-L5, CKD GFR 59.9, has seen Dr. Larrie Po at [facility] for low back surgery decompression and fusion L4-L5 and bone fusion in 2020.  I reviewed Dr. Lennie Ra notes, he drinks alcohol  every day, he drinks 4 or more drinks a week, drinks wine, average 2 drinks per day, never smoker, he has tingling and numbness, mild neuropathy not changing.  Per Dr. Alfonzo Angst, he needs a basic initial neuropathy evaluation possibly nerve conduction study as clinically warranted.  Patient is here alone and reports He has had back issus since 2008. He had surgery for stenosis in 2020 and since then he has weakness of the left > right leg. After surgery the weakness was more obvious. Before the surgery he had bilateral sciatica and that was improved after surgery. Prior to surgery he had  epidural steroid injections in 2018 and 2019 and when the sciatica  became worse he had decompression surgery. He feels he has strong leg muscles at the gym, he can't o a toe raise with the left foot, a foot drop, but the left was worse. The pain improved after the surgery. The left leg weakness improved a little bit but never came back to baseline and the left was the worst. The right leg is overall ok, not like the left leg. The weakness has been stable or slightly improved since 2020. The bottom of his feet feel strange sometimes like little sponges but no numbness or tingling in the toes or feet. No numbness in the hands, no weakness int he arms, no radicular cervical psin. No other focal neurologic deficits, associated symptoms, inciting events or modifiable factors.  Reviewed notes, labs and imaging from outside physicians, which showed:  Labs collected February 24, 2019 to include  CMP with creatinine 1.1, BUN 20 otherwise normal, CBC unremarkable, TSH normal, vitamin D  46.5.  MRI lumbar spine 2008 (reviewed images with patient and agree: Clinical Data:  Bilateral buttock and leg pain for five months, worsening with activity.  No acute injury or prior back surgery.  MRI LUMBAR SPINE WITHOUT CONTRAST:  Technique:  Multiplanar and multiecho pulse sequences of the lumbar spine, to include the lower thoracic and upper sacral regions, were obtained according to standard protocol without IV contrast.  Comparison:  Lumbar spine radiographs 05/09/06.  Findings:  There are five lumbar type vertebral bodies in anatomic alignment.  There is no evidence of fracture or pars defect. The lumbar pedicles are relatively short on a congenital basis.  The conus medullaris extends to the mid L1 level and appears normal.  There are no paraspinal abnormalities.    Sagittal images demonstrate the T11-12 and T12-L1 discs to be normal.    L1-2:  Mild disc bulge.  L2-3:  Mild disc bulge.    L3-4:  Disc bulging and slight thickening of the ligamentum flavum result in borderline central and  mild left lateral recess stenosis.  There is no foraminal compromise or definite nerve root encroachment.  L4-5:  There is moderate annular disc bulging with advanced bilateral facet hypertrophy, right greater than left.  There is a synovial cyst extending medially from the right facet joint and contributing to right L5 nerve root compression. The ligamentum flava are thickened bilaterally, and there is underlying severe central and lateral recess stenosis bilaterally.  The neural foramina are mildly narrowed bilaterally without definite L4 nerve root encroachment.  L5-S1:  Minimal disc bulge.  Mild bilateral facet hypertrophy.  The left lateral recess is mildly narrowed without definite nerve root encroachment.  The foramina appear sufficiently patent.  IMPRESSION:  1.  The most significant findings are at L4-5 where there is moderate to severe multifactorial spinal stenosis due to annular disc bulging and bilateral facet hypertrophy.  A right sided synovial cyst contributes to right lateral recess stenosis and right L5 nerve root compression.  2.  Mild left lateral recess stenosis at both L3-4 and L5-S1 secondary to asymmetric facet hypertrophy.  The spinal canal appears adequately patent at those levels, and there is no foraminal compromise.     Review of Systems: Patient complains of symptoms per HPI as well as the following symptoms weakness. Pertinent negatives and positives per HPI. All others negative.   Social History   Socioeconomic History   Marital status: Married    Spouse name: Not on file   Number of children: Not on file   Years of education: Not on file   Highest education level: Not on file  Occupational History   Not on file  Tobacco Use   Smoking status: Never   Smokeless tobacco: Never  Vaping Use   Vaping status: Never Used  Substance and Sexual Activity   Alcohol  use: Not Currently    Alcohol /week: 6.0 standard drinks of alcohol     Types: 6 Glasses of wine per week     Comment: Wine   Drug use: Never   Sexual activity: Yes    Partners: Female  Other Topics Concern   Not on file  Social History Narrative   6-7 hours of sleep a night 2 people living in the home Drinks caffienated beverages Exercise at least 3 times a weekTakes 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       Caffeine:  2 cups/day of coffee   Right handed   Social Drivers of Corporate investment banker Strain: Not on file  Food Insecurity: Not on file  Transportation Needs: Not on file  Physical Activity: Not on file  Stress: Not on file  Social Connections: Not on file  Intimate Partner Violence: Not on file    Family History  Problem Relation Age of Onset   Parkinson's disease Mother 31   Arthritis Father    Rheum arthritis Father    Rheum arthritis Sister    Colon polyps Brother    Prostate cancer Brother    Colon cancer Neg Hx    Neuropathy Neg Hx     Past Medical History:  Diagnosis Date   Allergy to alpha-gal    result of a tick bite   Factor 5 Leiden mutation, heterozygous (HCC)    GERD (gastroesophageal reflux disease)    History of chicken pox    History of colon polyps    Hypertension    Sciatic pain    Synovial cyst of lumbar spine     Patient Active Problem List   Diagnosis Date Noted   Ocular myasthenia gravis (HCC) 08/22/2022   Left leg weakness 04/29/2021   Decreased sensation of foot 04/29/2021   Spondylolisthesis, lumbar region 03/22/2018   Acute deep vein thrombosis (DVT) of left lower extremity (HCC) 11/11/2017   Allergy to alpha-gal 11/10/2017   Vitamin D  deficiency 08/25/2017   ECM (erythema chronicum migrans) 08/24/2017   Need for hepatitis C screening test 08/24/2017   Erectile dysfunction due to arterial insufficiency 08/24/2017   Essential hypertension 07/11/2014   Routine general medical examination at a health care facility 04/24/2013   History of colonic polyps  09/30/2009   BACK PAIN WITH RADICULOPATHY 05/19/2007    Past Surgical History:  Procedure Laterality Date   BACK SURGERY  2020   lumbar spine   COLONOSCOPY  multiple   TONSILLECTOMY AND ADENOIDECTOMY  03/16/1971    Current Outpatient Medications  Medication Sig Dispense Refill   apixaban  (ELIQUIS ) 2.5 MG TABS tablet Take 2.5 mg by mouth 2 (two) times daily.     Cyanocobalamin  2500 MCG SUBL Place 1 tablet under the tongue daily.     EPINEPHrine  0.3 mg/0.3 mL IJ SOAJ injection      ezetimibe (ZETIA) 10 MG tablet      fexofenadine (ALLEGRA) 180 MG tablet Take 180 mg by mouth daily as needed for allergies or rhinitis.     Multiple Vitamins-Minerals (MULTIVITAMIN PO) Take 1 tablet by mouth daily.     pyridostigmine  (MESTINON ) 60 MG tablet Take 1 tablet (60 mg total) by mouth 3 (three) times daily as needed. 90 tablet 1   rosuvastatin (CRESTOR) 20 MG tablet Take 20 mg by mouth at bedtime.     triamcinolone  (NASACORT ) 55 MCG/ACT AERO nasal inhaler Place 2 sprays into the nose daily as needed (for allergies).      VITAMIN D , CHOLECALCIFEROL , PO Take 5,000 Units by mouth daily.     No current facility-administered medications for this visit.    Allergies as of 08/22/2023 - Review Complete 08/22/2023  Allergen Reaction Noted   Gelatin Other (See Comments) 03/21/2018   Meat [alpha-gal] Hives, Rash, and Other (See Comments) 03/13/2018    Vitals: There were no vitals taken for this visit. Last Weight:  Wt Readings from Last 1 Encounters:  12/09/22 205 lb (93 kg)   Last Height:   Ht Readings from Last 1 Encounters:  12/09/22  5\' 11"  (1.803 m)    Physical exam: Exam: Gen: NAD, conversant      CV: No palpitations or chest pain or SOB. VS: Breathing at a normal rate. Weight appears within normal limits. Not febrile. Eyes: Conjunctivae clear without exudates or hemorrhage  Neuro: Detailed Neurologic Exam  Speech:    Speech is normal; fluent and spontaneous with normal  comprehension.  Cognition:    The patient is oriented to person, place, and time;     recent and remote memory intact;     language fluent;     normal attention, concentration, fund of knowledge Cranial Nerves:    The pupils are equal, round, and reactive to light. Visual fields are full Extraocular movements are intact.  The face is symmetric with normal sensation. The palate elevates in the midline. Hearing intact. Voice is normal. Shoulder shrug is normal. The tongue has normal motion without fasciculations.   Coordination: normal  Gait:    No abnormalities noted or reported  Motor Observation:   no involuntary movements noted. Tone:    Appears normal  Posture:    Posture is normal. normal erect    Strength:    Strength is anti-gravity and symmetric in the upper and lower limbs.      Sensation: intact to LT, no reports of numbness or tingling or paresthesias       Assessment/Plan: Patient with episodes of ptosis, diplopia. Extensive Workup has been unrevealing. Likely Ocular myasthenia gravis: Next time double vision  try mestinon , start with 1/2 pill and then try whole pill up to three times day. Also meds below not to take reviewed in the past.    - Very brief episodes a few times a month 10 to 15 seconds resolved with eye closure - If he has a severe episode we discussed Mestinon  treatment if needed - Symptoms have been ongoing for years at this point it is unlikely he will generalized but monitor and he understands the signs and symptoms to look for - Last exacerbation was February 2024 since then mild brief episodes as above - Discussed the autoimmune nature of myasthenia gravis, he does have rheumatoid arthritis in the family, discussed autoimmune disorders can be hereditary - We discussed supplementation at this time there is no recommendations for choline supplementation. There are things to avoid such as magnesium supplementation however - Follow-up 1 year in the  meantime contact us  if needed   Cc: Aldo Hun, MD,  Aldo Hun, MD  Aldona Amel, MD  [facility] [address] [place], [place] [address]  Phone [phone] Fax [phone]

## 2023-08-22 NOTE — Telephone Encounter (Signed)
 Please call for follow up video dr Tresia Fruit one year

## 2023-10-25 DIAGNOSIS — M4316 Spondylolisthesis, lumbar region: Secondary | ICD-10-CM | POA: Diagnosis not present

## 2023-12-20 DIAGNOSIS — E785 Hyperlipidemia, unspecified: Secondary | ICD-10-CM | POA: Diagnosis not present

## 2023-12-20 DIAGNOSIS — I10 Essential (primary) hypertension: Secondary | ICD-10-CM | POA: Diagnosis not present

## 2024-02-01 DIAGNOSIS — H532 Diplopia: Secondary | ICD-10-CM | POA: Diagnosis not present

## 2024-02-01 DIAGNOSIS — H2513 Age-related nuclear cataract, bilateral: Secondary | ICD-10-CM | POA: Diagnosis not present

## 2024-02-01 DIAGNOSIS — H524 Presbyopia: Secondary | ICD-10-CM | POA: Diagnosis not present

## 2024-08-21 ENCOUNTER — Ambulatory Visit: Admitting: Neurology

## 2024-08-21 ENCOUNTER — Telehealth: Admitting: Neurology
